# Patient Record
Sex: Female | Born: 1965 | Hispanic: No | Smoking: Current every day smoker
Health system: Southern US, Community
[De-identification: ages and names within clinical notes are randomized; demographics above are authoritative.]

## PROBLEM LIST (undated history)

## (undated) DIAGNOSIS — G5600 Carpal tunnel syndrome, unspecified upper limb: Secondary | ICD-10-CM

## (undated) DIAGNOSIS — M5136 Other intervertebral disc degeneration, lumbar region: Secondary | ICD-10-CM

## (undated) DIAGNOSIS — M51369 Other intervertebral disc degeneration, lumbar region without mention of lumbar back pain or lower extremity pain: Secondary | ICD-10-CM

## (undated) DIAGNOSIS — M5030 Other cervical disc degeneration, unspecified cervical region: Secondary | ICD-10-CM

## (undated) HISTORY — PX: OVARIAN CYST REMOVAL: SHX89

## (undated) HISTORY — PX: OOPHORECTOMY: SHX86

---

## 2002-07-22 ENCOUNTER — Encounter: Payer: Self-pay | Admitting: Emergency Medicine

## 2002-07-22 ENCOUNTER — Emergency Department (HOSPITAL_COMMUNITY): Admission: EM | Admit: 2002-07-22 | Discharge: 2002-07-22 | Payer: Self-pay | Admitting: Emergency Medicine

## 2005-09-09 ENCOUNTER — Emergency Department (HOSPITAL_COMMUNITY): Admission: EM | Admit: 2005-09-09 | Discharge: 2005-09-09 | Payer: Self-pay | Admitting: Emergency Medicine

## 2006-09-29 ENCOUNTER — Ambulatory Visit (HOSPITAL_COMMUNITY): Admission: RE | Admit: 2006-09-29 | Discharge: 2006-09-29 | Payer: Self-pay | Admitting: Family Medicine

## 2006-11-05 ENCOUNTER — Ambulatory Visit (HOSPITAL_COMMUNITY): Admission: RE | Admit: 2006-11-05 | Discharge: 2006-11-05 | Payer: Self-pay | Admitting: Obstetrics

## 2006-12-02 ENCOUNTER — Ambulatory Visit (HOSPITAL_COMMUNITY): Admission: RE | Admit: 2006-12-02 | Discharge: 2006-12-02 | Payer: Self-pay | Admitting: Obstetrics

## 2007-01-09 ENCOUNTER — Ambulatory Visit (HOSPITAL_COMMUNITY): Admission: RE | Admit: 2007-01-09 | Discharge: 2007-01-09 | Payer: Self-pay | Admitting: Obstetrics

## 2007-03-04 ENCOUNTER — Ambulatory Visit (HOSPITAL_COMMUNITY): Admission: RE | Admit: 2007-03-04 | Discharge: 2007-03-04 | Payer: Self-pay | Admitting: Obstetrics

## 2007-03-26 ENCOUNTER — Ambulatory Visit (HOSPITAL_COMMUNITY): Admission: RE | Admit: 2007-03-26 | Discharge: 2007-03-26 | Payer: Self-pay | Admitting: Obstetrics

## 2007-03-26 ENCOUNTER — Encounter: Payer: Self-pay | Admitting: Obstetrics

## 2007-08-13 ENCOUNTER — Ambulatory Visit (HOSPITAL_COMMUNITY): Admission: RE | Admit: 2007-08-13 | Discharge: 2007-08-13 | Payer: Self-pay | Admitting: Obstetrics

## 2008-06-20 IMAGING — MG MM DIGITAL SCREENING BILAT
5 series · 5 of 5 positions shown · non-contrast
Comparison: none

DG SCREEN MAMMOGRAM BILATERAL
Bilateral CC and MLO view(s) were taken.

DIGITAL SCREENING MAMMOGRAM WITH CAD:
There is a fibroglandular pattern.  No masses or malignant type calcifications are identified.

[R CC]
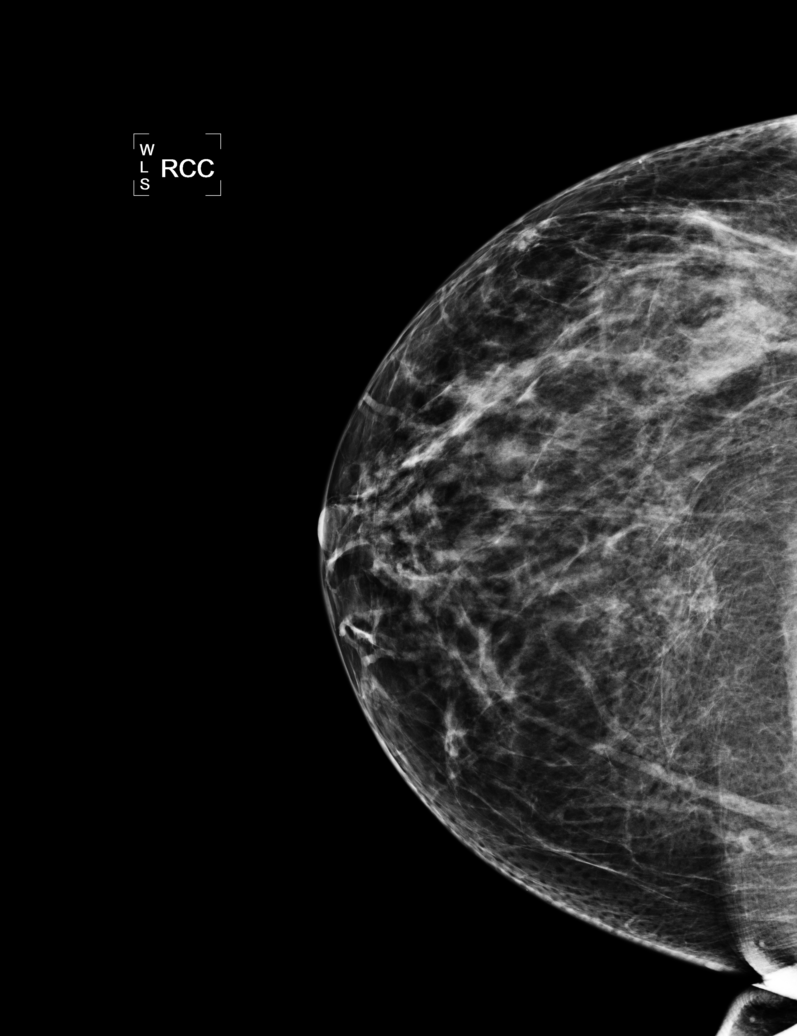

[R MLO]
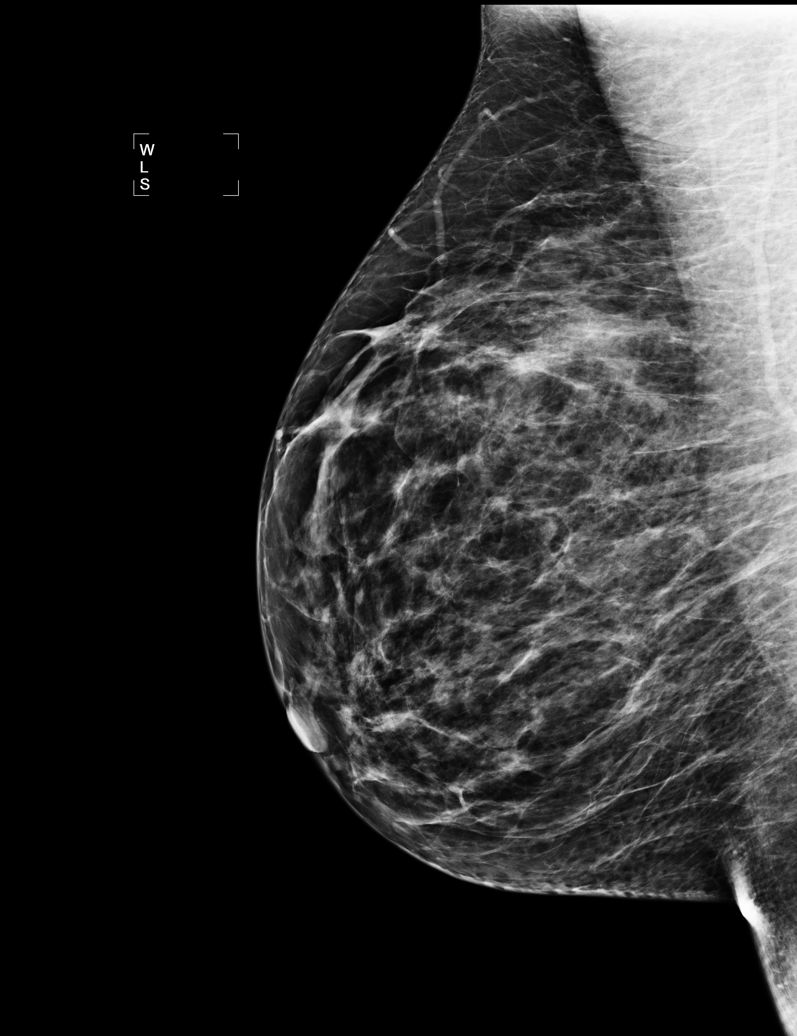

[L CC]
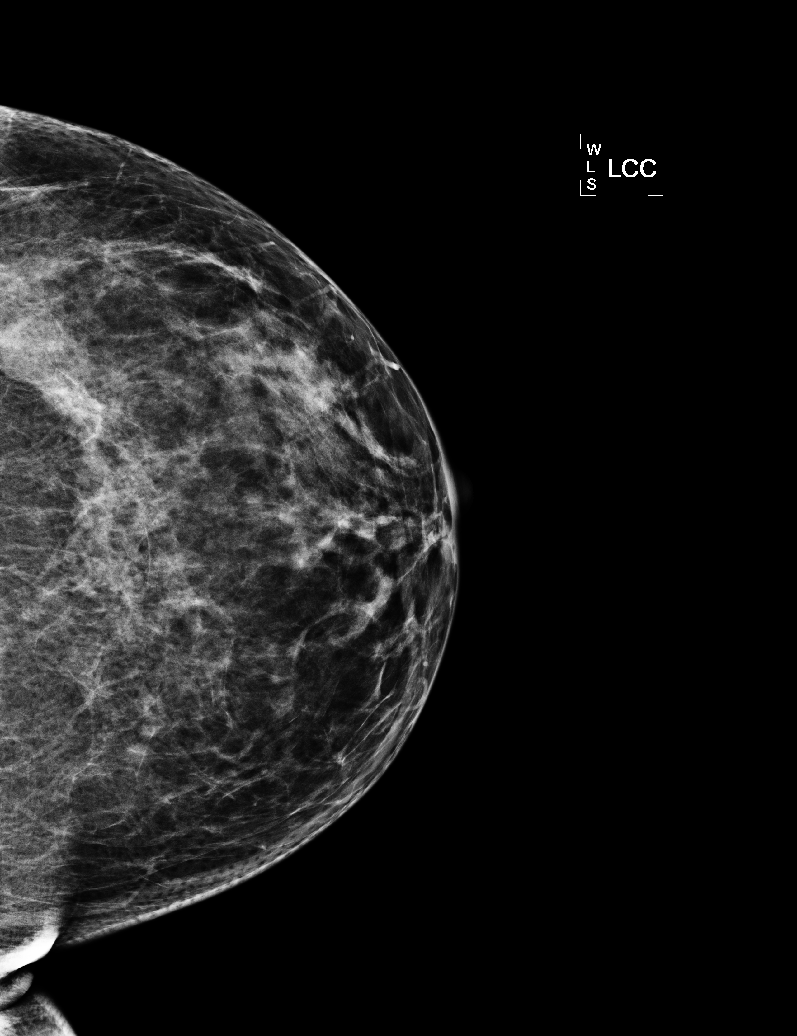

[L MLO (1 of 2)]
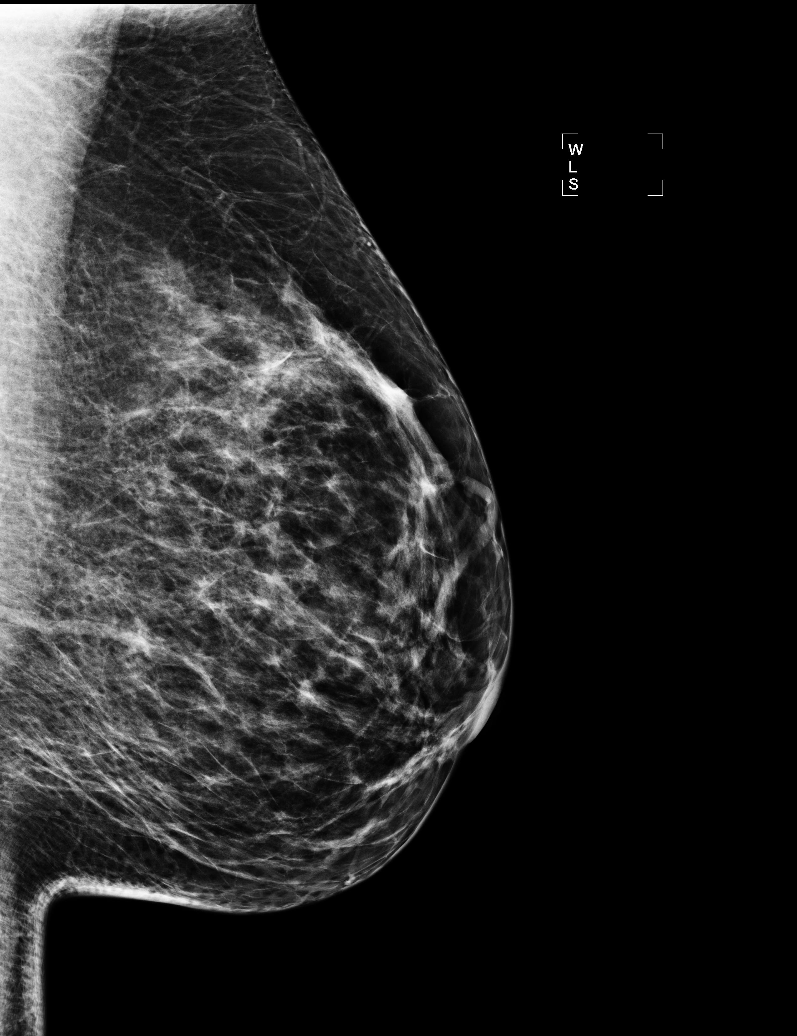

[L MLO (2 of 2)]
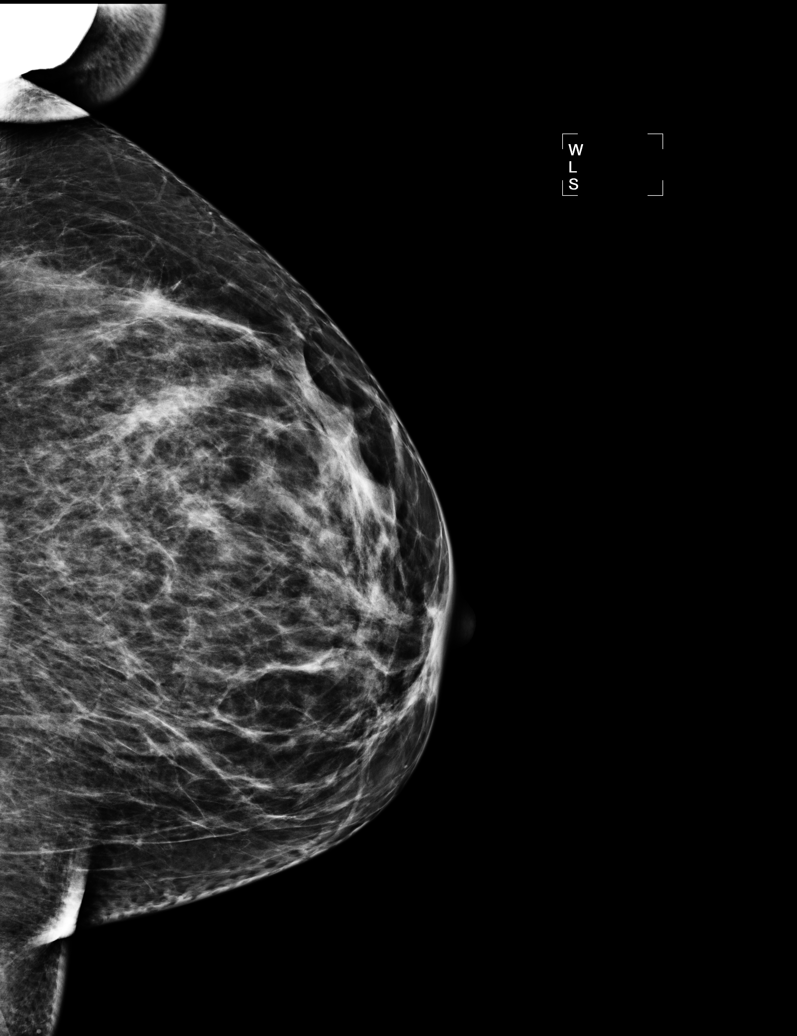

[5 of 5 positions shown; findings below may reference images not displayed]

IMPRESSION: No specific mammographic evidence of malignancy.  Next screening mammogram is recommended in one 
year.

ASSESSMENT: Negative - BI-RADS 1

Screening mammogram in 1 year.
ANALYZED BY COMPUTER AIDED DETECTION. , THIS PROCEDURE WAS A DIGITAL MAMMOGRAM.

## 2008-10-13 ENCOUNTER — Emergency Department (HOSPITAL_BASED_OUTPATIENT_CLINIC_OR_DEPARTMENT_OTHER): Admission: EM | Admit: 2008-10-13 | Discharge: 2008-10-13 | Payer: Self-pay | Admitting: Emergency Medicine

## 2009-05-18 ENCOUNTER — Emergency Department (HOSPITAL_BASED_OUTPATIENT_CLINIC_OR_DEPARTMENT_OTHER): Admission: EM | Admit: 2009-05-18 | Discharge: 2009-05-18 | Payer: Self-pay | Admitting: Emergency Medicine

## 2009-05-18 ENCOUNTER — Ambulatory Visit: Payer: Self-pay | Admitting: Diagnostic Radiology

## 2010-07-15 ENCOUNTER — Encounter: Payer: Self-pay | Admitting: Obstetrics

## 2010-07-16 ENCOUNTER — Encounter: Payer: Self-pay | Admitting: Obstetrics

## 2010-10-03 LAB — DIFFERENTIAL
Basophils Absolute: 0.1 10*3/uL (ref 0.0–0.1)
Eosinophils Relative: 15 % — ABNORMAL HIGH (ref 0–5)
Lymphocytes Relative: 29 % (ref 12–46)
Lymphs Abs: 2.6 10*3/uL (ref 0.7–4.0)
Neutro Abs: 4.1 10*3/uL (ref 1.7–7.7)
Neutrophils Relative %: 46 % (ref 43–77)

## 2010-10-03 LAB — CBC
HCT: 38.3 % (ref 36.0–46.0)
Platelets: 284 10*3/uL (ref 150–400)
RDW: 13.3 % (ref 11.5–15.5)
WBC: 8.9 10*3/uL (ref 4.0–10.5)

## 2010-10-03 LAB — URINALYSIS, ROUTINE W REFLEX MICROSCOPIC
Bilirubin Urine: NEGATIVE
Ketones, ur: NEGATIVE mg/dL
Nitrite: NEGATIVE
Specific Gravity, Urine: 1.006 (ref 1.005–1.030)
Urobilinogen, UA: 0.2 mg/dL (ref 0.0–1.0)
pH: 6 (ref 5.0–8.0)

## 2010-10-03 LAB — BASIC METABOLIC PANEL
BUN: 10 mg/dL (ref 6–23)
Chloride: 108 mEq/L (ref 96–112)
Creatinine, Ser: 0.8 mg/dL (ref 0.4–1.2)
GFR calc non Af Amer: >60 mL/min (ref >60)

## 2010-10-03 LAB — PREGNANCY, URINE

## 2010-11-06 NOTE — Op Note (Signed)
Angel Tapia, Angel Tapia               ACCOUNT NO.:  [id number]   MEDICAL RECORD NO.:  [id number]          PATIENT TYPE:  AMB   LOCATION:  SDC                           FACILITY:  [facility]   PHYSICIAN:  Charles A. Clearance Coots, M.D.DATE OF BIRTH:  [birth year]   DATE OF PROCEDURE:  03/26/2007  DATE OF DISCHARGE:                               OPERATIVE REPORT   PREOPERATIVE DIAGNOSIS:  Left ovarian cyst.   POSTOPERATIVE DIAGNOSIS:  Left ovarian cyst.   PROCEDURE:  Laparoscopic ovarian cystectomy, left laparoscopic  salpingectomy.   SURGEON:  Coral Ceo, M.D., Antionette Char, M.D.   ANESTHESIA:  General.   ESTIMATED BLOOD LOSS:  Minimal   COMPLICATIONS:  None.   SPECIMEN:  Left ovarian cyst, left fallopian tube.   FINDINGS:  Left ovarian cyst containing chocolate-colored fluid.   OPERATION:  The patient was brought to the operating room after  satisfactory general endotracheal anesthesia.  The abdomen and vagina  was prepped and draped in the usual sterile fashion.  The legs were  brought up in stirrups, and the urinary bladder was emptied of  approximately 100 mL of clear fluid.  Bimanual exam done.  Sterile  speculum inserted in vaginal vault.  An acorn uterine manipulator was  then inserted through the cervix and into the uterine cavity in routine  fashion.  A small inferior umbilical incision was then made with the  scalpel, and the umbilical port was placed in routine fashion using the  open Hasson technique without complications.  A suprapubic port was also  placed in routine fashion along with two lateral lower quadrant ports.  The umbilical port was a 12-mm port along with the suprapubic port, and  the 2 lateral lower quadrant ports were 5-mm ports.  Laparoscopic survey  was then done of the pelvic cavity, and the left ovary was noted to be  enlarged and was grasped with atraumatic grasping forceps.  The ovarian  wall was incised using cutting forceps, and the ovarian cyst  wall was  then identified, and the ovarian capsule was systematically dissected  away from the cyst wall, and the cyst wall was removed.  However, at the  end of procedure, the cyst wall was ruptured, and chocolate-colored  fluid was expelled resembling endometriosis-type contents.  The cyst  wall was removed and submitted to pathology for evaluation.  Fallopian  tube had been traumatized during removal of the left ovarian cyst, and a  decision was made to perform a left salpingectomy to remove the  traumatized fallopian tube.  The fallopian tube was grasped with the  atraumatic grasper, and Kleppinger forceps were placed across the broad  ligament and the systematically cauterized and cut until the corneal end  of the tube was reached.  Then, Kleppinger forceps were placed across  the corneal end of the fallopian tube, and the tube was then completely  excised and submitted to pathology for evaluation.  Pelvic cavity was  then thoroughly irrigated with warm saline solution with the Nezhat  irrigator, and the right Falope right fallopian tube and ovary were  examined, and there were no visual  abnormalities of the right tube and  ovary.  The anterior and posterior cul-de-sacs were examined, and there  were no significant abnormalities noted.  There were no other  significant adhesions noted.  There were no endometriotic implants  observed.  The procedure was therefore terminated, and all instruments  were removed.  The umbilical port was suture ligated with interrupted  sutures in the fascia of 0 Vicryl.  The suprapubic port was suture  ligated with fascial interrupted sutures of 0 Vicryl.  The two lower  quadrant lateral ports were suture ligated with subcuticular sutures of  3-0 Monocryl, and Dermabond adhesive solution was applied to the skin on  each port closure.  Surgical technician indicated that all needle,  sponge and instrument counts were correct x2.  The patient tolerated the   procedure well, was transported to recovery room in satisfactory  condition.      Charles A. Clearance Coots, M.D.  Electronically Signed     CAH/MEDQ  D:  03/26/2007  T:  03/27/2007  Job:  [id number]

## 2011-04-04 LAB — CBC
HCT: 39
Hemoglobin: 13.3
MCV: 89.2
Platelets: 271
RBC: 4.37
WBC: 7

## 2014-03-22 ENCOUNTER — Encounter (HOSPITAL_COMMUNITY): Payer: Self-pay | Admitting: Emergency Medicine

## 2014-03-22 ENCOUNTER — Emergency Department (HOSPITAL_COMMUNITY)
Admission: EM | Admit: 2014-03-22 | Discharge: 2014-03-23 | Disposition: A | Discharge status: Home / Self Care | Payer: Medicaid Other | Attending: Emergency Medicine | Admitting: Emergency Medicine

## 2014-03-22 DIAGNOSIS — Z8669 Personal history of other diseases of the nervous system and sense organs: Secondary | ICD-10-CM | POA: Diagnosis not present

## 2014-03-22 DIAGNOSIS — K625 Hemorrhage of anus and rectum: Secondary | ICD-10-CM | POA: Diagnosis present

## 2014-03-22 DIAGNOSIS — Z3202 Encounter for pregnancy test, result negative: Secondary | ICD-10-CM | POA: Diagnosis not present

## 2014-03-22 DIAGNOSIS — N838 Other noninflammatory disorders of ovary, fallopian tube and broad ligament: Secondary | ICD-10-CM | POA: Insufficient documentation

## 2014-03-22 DIAGNOSIS — Z8739 Personal history of other diseases of the musculoskeletal system and connective tissue: Secondary | ICD-10-CM | POA: Insufficient documentation

## 2014-03-22 DIAGNOSIS — K644 Residual hemorrhoidal skin tags: Secondary | ICD-10-CM | POA: Insufficient documentation

## 2014-03-22 DIAGNOSIS — R109 Unspecified abdominal pain: Secondary | ICD-10-CM | POA: Insufficient documentation

## 2014-03-22 DIAGNOSIS — F17200 Nicotine dependence, unspecified, uncomplicated: Secondary | ICD-10-CM | POA: Insufficient documentation

## 2014-03-22 HISTORY — DX: Carpal tunnel syndrome, unspecified upper limb: G56.00

## 2014-03-22 HISTORY — DX: Other cervical disc degeneration, unspecified cervical region: M50.30

## 2014-03-22 HISTORY — DX: Other intervertebral disc degeneration, lumbar region without mention of lumbar back pain or lower extremity pain: M51.369

## 2014-03-22 HISTORY — DX: Other intervertebral disc degeneration, lumbar region: M51.36

## 2014-03-22 NOTE — ED Provider Notes (Signed)
CSN: [id number]     Arrival date & time 03/22/14  2223 History   This chart was scribed for Lyanne Co, MD, by Yevette Edwards, ED Scribe. This patient was seen in room APA19/APA19 and the patient's care was started at 11:51 PM.  First MD Initiated Contact with Patient 03/22/14 2349     Chief Complaint  Patient presents with  . Rectal Bleeding    The history is provided by the patient. No language interpreter was used.    HPI Comments: Angel Tapia is a 48 y.o. female who presents to the Emergency Department complaining of three months of intermittent rectal bleeding which has increased in the past four days and which is associated with rectal pain. The pt reports blood on the toilet paper after a BM as well as blood in the toilet after a BM; she denies blood without attempted BM and she denies hematochezia. She correlates the rectal bleeding to menstruations stating rectal bleeding occurs prior to menstruation and then stops during menstruation. She reports her menstrual cycle is 10 days late; she denies a h/o irregular menstruations. She also endorses associated right-sided abdominal pain which radiates to her back and her leg. The pain is increased with standing and squatting. She reports the abdominal pain is constant, and she denies a h/o similar symptoms. She also endorses low-grade fever; in the ED her temperature is 99 F. She denies constipation and diarrhea.   She voices an right oophorectomy due to a cyst. She denies a h/o Caesarian sections.   Past Medical History  Diagnosis Date  . DDD (degenerative disc disease), cervical   . DDD (degenerative disc disease), lumbar   . Carpal tunnel syndrome    Past Surgical History  Procedure Laterality Date  . Ovarian cyst removal    . Oophorectomy     No family history on file. History  Substance Use Topics  . Smoking status: Current Every Day Smoker -- 1.00 packs/day  . Smokeless tobacco: Not on file  . Alcohol Use: No   No OB  history provided.  Review of Systems  A complete 10 system review of systems was obtained, and all systems were negative except where indicated in the HPI and PE.    Allergies  Review of patient's allergies indicates no known allergies.  Home Medications   Prior to Admission medications   Not on File   Triage Vitals: BP 145/90  Pulse 79  Temp(Src) 99 F (37.2 C) (Oral)  Resp 22  Ht 5\' 3"  (1.6 m)  Wt 160 lb (72.576 kg)  BMI 28.35 kg/m2  SpO2 100%  LMP 02/15/2014  Physical Exam  Nursing note and vitals reviewed. Constitutional: She is oriented to person, place, and time. She appears well-developed and well-nourished.  HENT:  Head: Normocephalic.  Eyes: EOM are normal.  Neck: Normal range of motion.  Pulmonary/Chest: Effort normal.  Abdominal: She exhibits no distension.  Moderate tenderness to right side of abdomen.   Genitourinary:  Rectal exam performed with permission of pt. Chaperon present. Small non-bleeding external hemorrhoid. No fissures noted. No palpable hemorrhoids. No masses. No gross blood.   Musculoskeletal: Normal range of motion.  Neurological: She is alert and oriented to person, place, and time.  Psychiatric: She has a normal mood and affect.    ED Course  Procedures (including critical care time)  DIAGNOSTIC STUDIES: Oxygen Saturation is 100% on room air, normal by my interpretation.    COORDINATION OF CARE:  12:01 AM- Discussed treatment  plan with patient, and the patient agreed to the plan. The plan includes lab work, a CT scan, and pain medication.   Labs Review Labs Reviewed  URINALYSIS, ROUTINE W REFLEX MICROSCOPIC - Abnormal; Notable for the following:    Specific Gravity, Urine <1.005 (*)    All other components within normal limits  CBC  BASIC METABOLIC PANEL  PREGNANCY, URINE    Imaging Review Ct Abdomen Pelvis W Contrast  03/23/2014   CLINICAL DATA:  Rectal bleeding intermittently for 4 months. Right-sided abdominal pain.   EXAM: CT ABDOMEN AND PELVIS WITH CONTRAST  TECHNIQUE: Multidetector CT imaging of the abdomen and pelvis was performed using the standard protocol following bolus administration of intravenous contrast.  CONTRAST:  50mL OMNIPAQUE IOHEXOL 300 MG/ML SOLN, OMNIPAQUE IOHEXOL 300 MG/ML SOLN  COMPARISON:  None.  FINDINGS: Lung bases demonstrate tiny nodules on the left, with 2 nodules measuring 2 mm diameter.  Mild prominence of intrahepatic bile ducts without significant extrahepatic bile duct dilatation. No common duct stones are visualized. Gallbladder is unremarkable. Correlate with liver function tests. No focal liver lesions appreciated. Pancreas, spleen, adrenal glands, kidneys, abdominal aorta, inferior vena cava, and retroperitoneal lymph nodes are unremarkable. Stomach, small bowel, and colon appear normal for degree of distention. Contrast material flows through to the colon without evidence of obstruction. No free air or free fluid in the abdomen.  Pelvis: Appendix is normal. Rectosigmoid colon is unremarkable for under distended state. Bladder wall is not thickened. Uterus is normal. Ovaries are prominent, probably containing cysts. Right ovary measures 5.4 x 4.1 cm and left 3.9 x 3.2 cm. Consider ultrasound for further evaluation. No free or loculated pelvic fluid collections. No destructive bone lesions.  IMPRESSION: Prominence of the ovaries with right ovary measuring 5.4 x 4.1 cm, likely cysts. Recommend ultrasound for characterization. No acute inflammatory process demonstrated in the abdomen or pelvis. Nonspecific mild prominence of intrahepatic bile ducts. Correlate with liver function test. If findings suggests obstruction, consider elective MRCP for further evaluation. 2 mm nodules in the left lung base. If the patient is at high risk for bronchogenic carcinoma, follow-up chest CT at 1 year is recommended. If the patient is at low risk, no follow-up is needed. This recommendation follows the  consensus statement: Guidelines for Management of Small Pulmonary Nodules Detected on CT Scans: A Statement from the Fleischner Society as published in Radiology 2005; 237:395-400.   Electronically Signed   By: Burman Nieves M.D.   On: 03/23/2014 02:33  I personally reviewed the imaging tests through PACS system I reviewed available ER/hospitalization records through the EMR     EKG Interpretation None      MDM   Final diagnoses:  Abdominal pain, unspecified abdominal location  Rectal bleeding  Mass of right ovary    Nonspecific rectal bleeding.  Outpatient GI followup.  Vital signs normal.  Mild right-sided abdominal pain.  Given the persistence of this a CT scan was performed.  Prominent right ovary was found and an outpatient ultrasound was ordered to be performed at [facility].  She was given information regarding her primary care doctor.  I also informed the patient of her pulmonary nodule that will need repeat CT scan in one year.  She does smoke cigarettes.  Overall the patient is feeling better.  Discharge home in good condition.  She understands to return to the ER for new or worsening symptoms  I personally performed the services described in this documentation, which was scribed in  my presence. The recorded information has been reviewed and is accurate.        Lyanne CoKevin M Fabricio Endsley, MD 03/23/14 [phone]

## 2014-03-22 NOTE — ED Notes (Signed)
Pt c/o rectal bleeding x 2 months. States it isn't with her stool but rather bright red blood "just coming out" off and on the last couple of months. Pt states today she began passing darker red blood "clots".

## 2014-03-23 ENCOUNTER — Emergency Department (HOSPITAL_COMMUNITY): Payer: Medicaid Other

## 2014-03-23 LAB — BASIC METABOLIC PANEL
ANION GAP: 10 (ref 5–15)
BUN: 10 mg/dL (ref 6–23)
CALCIUM: 9.1 mg/dL (ref 8.4–10.5)
CHLORIDE: 101 meq/L (ref 96–112)
CO2: 26 meq/L (ref 19–32)
Creatinine, Ser: 0.78 mg/dL (ref 0.50–1.10)
GFR calc non Af Amer: >90 mL/min (ref >90)
Glucose, Bld: 88 mg/dL (ref 70–99)
Potassium: 3.7 mEq/L (ref 3.7–5.3)
SODIUM: 137 meq/L (ref 137–147)

## 2014-03-23 LAB — URINALYSIS, ROUTINE W REFLEX MICROSCOPIC
BILIRUBIN URINE: NEGATIVE
Glucose, UA: NEGATIVE mg/dL
Hgb urine dipstick: NEGATIVE
Ketones, ur: NEGATIVE mg/dL
Leukocytes, UA: NEGATIVE
NITRITE: NEGATIVE
PROTEIN: NEGATIVE mg/dL
UROBILINOGEN UA: 0.2 mg/dL (ref 0.0–1.0)
pH: 5.5 (ref 5.0–8.0)

## 2014-03-23 LAB — CBC
HCT: 38.6 % (ref 36.0–46.0)
Hemoglobin: 13.2 g/dL (ref 12.0–15.0)
MCH: 28.9 pg (ref 26.0–34.0)
MCHC: 34.2 g/dL (ref 30.0–36.0)
MCV: 84.6 fL (ref 78.0–100.0)
PLATELETS: 335 10*3/uL (ref 150–400)
RBC: 4.56 MIL/uL (ref 3.87–5.11)
RDW: 14.6 % (ref 11.5–15.5)
WBC: 7.3 10*3/uL (ref 4.0–10.5)

## 2014-03-23 LAB — PREGNANCY, URINE: PREG TEST UR: NEGATIVE

## 2014-03-23 MED ORDER — HYDROCODONE-ACETAMINOPHEN 5-325 MG PO TABS: 1.0000 | ORAL_TABLET | ORAL | Status: DC | PRN

## 2014-03-23 MED ORDER — IOHEXOL 300 MG/ML  SOLN
50.0000 mL | Freq: Once | INTRAMUSCULAR | Status: AC | PRN
  Administered 2014-03-23: 50 mL via ORAL

## 2014-03-23 MED ORDER — MORPHINE SULFATE 4 MG/ML IJ SOLN
8.0000 mg | Freq: Once | INTRAMUSCULAR | Status: AC
  Administered 2014-03-23: 8 mg via INTRAVENOUS
  Filled 2014-03-23: qty 2

## 2014-03-23 MED ORDER — IOHEXOL 300 MG/ML  SOLN
100.0000 mL | Freq: Once | INTRAMUSCULAR | Status: AC | PRN
  Administered 2014-03-23: 100 mL via INTRAVENOUS

## 2014-03-23 MED ORDER — HYDROCODONE-ACETAMINOPHEN 5-325 MG PO TABS
1.0000 | ORAL_TABLET | Freq: Once | ORAL | Status: AC
  Administered 2014-03-23: 1 via ORAL
  Filled 2014-03-23: qty 1

## 2014-03-28 ENCOUNTER — Telehealth: Payer: Self-pay

## 2014-03-28 NOTE — Telephone Encounter (Signed)
patient called to schedule colonoscopy  [phone]

## 2014-04-06 NOTE — Telephone Encounter (Signed)
LMOM to call.

## 2014-04-06 NOTE — Telephone Encounter (Signed)
Pt having rectal bleeding needs a NPV WITHIN 1-2 WEEKS TO DISCUSS RECTAL BLEEDING AND TCS.

## 2014-04-07 ENCOUNTER — Encounter: Payer: Self-pay | Admitting: Gastroenterology

## 2014-04-08 NOTE — Telephone Encounter (Signed)
Called pt and she said ok to schedule appt. Please leave the message on Vm if she does not answer.  She has a bad signal sometimes.

## 2014-04-08 NOTE — Telephone Encounter (Signed)
OPV OCT 21 AT 1130.

## 2014-04-11 NOTE — Telephone Encounter (Signed)
APPT MADE AND L/M ON CELL PHONE

## 2014-04-13 ENCOUNTER — Ambulatory Visit: Payer: Medicaid Other | Admitting: Gastroenterology

## 2014-04-15 ENCOUNTER — Ambulatory Visit: Payer: Medicaid Other | Admitting: Gastroenterology

## 2014-05-11 ENCOUNTER — Ambulatory Visit: Payer: Medicaid Other | Admitting: Gastroenterology

## 2014-05-17 ENCOUNTER — Ambulatory Visit: Payer: Medicaid Other | Admitting: Gastroenterology

## 2014-05-17 ENCOUNTER — Telehealth: Payer: Self-pay | Admitting: Gastroenterology

## 2014-05-17 NOTE — Telephone Encounter (Signed)
Patient called and confirmed appt 05/16/14 and was a no show on 05/17/14

## 2016-08-11 ENCOUNTER — Emergency Department (HOSPITAL_COMMUNITY)
Admission: EM | Admit: 2016-08-11 | Discharge: 2016-08-12 | Disposition: A | Discharge status: Home / Self Care | Payer: Medicaid Other | Attending: Emergency Medicine | Admitting: Emergency Medicine

## 2016-08-11 ENCOUNTER — Encounter (HOSPITAL_COMMUNITY): Payer: Self-pay

## 2016-08-11 DIAGNOSIS — J329 Chronic sinusitis, unspecified: Secondary | ICD-10-CM | POA: Insufficient documentation

## 2016-08-11 DIAGNOSIS — F17200 Nicotine dependence, unspecified, uncomplicated: Secondary | ICD-10-CM | POA: Insufficient documentation

## 2016-08-11 DIAGNOSIS — J111 Influenza due to unidentified influenza virus with other respiratory manifestations: Secondary | ICD-10-CM | POA: Insufficient documentation

## 2016-08-11 NOTE — ED Provider Notes (Signed)
[facility] DEPT Provider Note   CSN: [id number] Arrival date & time: 08/11/16  2234     History   Chief Complaint Chief Complaint  Patient presents with  . Generalized Body Aches  . Fever    HPI Angel Tapia is a 51 y.o. female.  The history is provided by the patient.  URI   This is a new problem. The current episode started 2 days ago. The problem has been gradually worsening. The maximum temperature recorded prior to her arrival was 102 to 102.9 F. Associated symptoms include nausea, congestion, headaches, sneezing, swollen glands and cough. Pertinent negatives include no chest pain, no abdominal pain, no diarrhea, no vomiting, no dysuria, no sore throat, no neck pain and no wheezing. Associated symptoms comments: Glands sore in neck. She has tried NSAIDs for the symptoms. The treatment provided mild relief.    Past Medical History:  Diagnosis Date  . Carpal tunnel syndrome   . DDD (degenerative disc disease), cervical   . DDD (degenerative disc disease), lumbar     There are no active problems to display for this patient.   Past Surgical History:  Procedure Laterality Date  . OOPHORECTOMY    . OVARIAN CYST REMOVAL      OB History    No data available       Home Medications    Prior to Admission medications   Not on File    Family History No family history on file.  Social History Social History  Substance Use Topics  . Smoking status: Current Every Day Smoker    Packs/day: 1.00  . Smokeless tobacco: Never Used  . Alcohol use No     Allergies   Patient has no known allergies.   Review of Systems Review of Systems  Constitutional: Positive for fever. Negative for activity change.       All ROS Neg except as noted in HPI  HENT: Positive for congestion and sneezing. Negative for nosebleeds and sore throat.   Eyes: Negative for photophobia and discharge.  Respiratory: Positive for cough. Negative for shortness of breath and wheezing.     Cardiovascular: Negative for chest pain and palpitations.  Gastrointestinal: Positive for nausea. Negative for abdominal pain, blood in stool, diarrhea and vomiting.  Genitourinary: Negative for dysuria, frequency and hematuria.  Musculoskeletal: Positive for myalgias. Negative for arthralgias, back pain and neck pain.  Skin: Negative.   Neurological: Positive for headaches. Negative for dizziness, seizures and speech difficulty.  Psychiatric/Behavioral: Negative for confusion and hallucinations.     Physical Exam Updated Vital Signs BP 149/87 (BP Location: Left Arm)   Pulse 90   Temp 98.7 F (37.1 C) (Oral)   Resp 17   Ht 5\' 3"  (1.6 m)   Wt 77.1 kg   LMP 08/11/2016 (LMP Unknown)   SpO2 98%   BMI 30.11 kg/m   Physical Exam  Constitutional: She is oriented to person, place, and time. She appears well-developed and well-nourished.  Non-toxic appearance.  HENT:  Head: Normocephalic.  Right Ear: Tympanic membrane and external ear normal.  Left Ear: Tympanic membrane and external ear normal.  Pain to percussion over sinuses. Nasal congestion present.  Eyes: EOM and lids are normal. Pupils are equal, round, and reactive to light.  Neck: Normal range of motion. Neck supple. Carotid bruit is not present.  Cardiovascular: Normal rate, regular rhythm, normal heart sounds, intact distal pulses and normal pulses.   Pulmonary/Chest: No respiratory distress. She has wheezes.  Soft wheezes of  the upper lung area  Abdominal: Soft. Bowel sounds are normal. There is no tenderness. There is no guarding.  Musculoskeletal: Normal range of motion.  Lymphadenopathy:       Head (right side): No submandibular adenopathy present.       Head (left side): No submandibular adenopathy present.    She has no cervical adenopathy.  Neurological: She is alert and oriented to person, place, and time. She has normal strength. No cranial nerve deficit or sensory deficit.  Skin: Skin is warm and dry.   Psychiatric: She has a normal mood and affect. Her speech is normal.  Nursing note and vitals reviewed.    ED Treatments / Results  Labs (all labs ordered are listed, but only abnormal results are displayed) Labs Reviewed - No data to display  EKG  EKG Interpretation None       Radiology No results found.  Procedures Procedures (including critical care time)  Medications Ordered in ED Medications - No data to display   Initial Impression / Assessment and Plan / ED Course  I have reviewed the triage vital signs and the nursing notes.  Pertinent labs & imaging results that were available during my care of the patient were reviewed by me and considered in my medical decision making (see chart for details).     *I have reviewed nursing notes, vital signs, and all appropriate lab and imaging results for this patient.**  Final Clinical Impressions(s) / ED Diagnoses MDM Pt has hx of fever, chills, bodyaches, and sinus headaches. Pt is able to hydrate. Pulse ox 98% on room air. Pt speaks in complete sentences. I suspect influenza and sinusitis. Pt to be placed on cleocin, decadron and claritin D. Pt to follow up with primary MD or return to the ED if any changes or problem. Pt in agreement with this plan.   Final diagnoses:  None    New Prescriptions New Prescriptions   No medications on file     Ivery QualeHobson Azizi Bally, PA-C 08/12/16 1149    Zadie Rhineonald Wickline, MD 08/13/16 [phone]

## 2016-08-11 NOTE — ED Triage Notes (Signed)
Reports of body aches, fever and productive cough. States her "lungs feel sore".

## 2016-08-12 MED ORDER — IBUPROFEN 800 MG PO TABS
800.0000 mg | ORAL_TABLET | Freq: Once | ORAL | Status: AC
  Administered 2016-08-12: 800 mg via ORAL
  Filled 2016-08-12: qty 1

## 2016-08-12 MED ORDER — LORATADINE-PSEUDOEPHEDRINE ER 5-120 MG PO TB12: 1.0000 | ORAL_TABLET | Freq: Two times a day (BID) | ORAL | 0 refills | Status: DC

## 2016-08-12 MED ORDER — DEXAMETHASONE 4 MG PO TABS: 4.0000 mg | ORAL_TABLET | Freq: Two times a day (BID) | ORAL | 0 refills | Status: DC

## 2016-08-12 MED ORDER — ONDANSETRON HCL 4 MG PO TABS
4.0000 mg | ORAL_TABLET | Freq: Once | ORAL | Status: AC
  Administered 2016-08-12: 4 mg via ORAL
  Filled 2016-08-12: qty 1

## 2016-08-12 MED ORDER — CLINDAMYCIN HCL 300 MG PO CAPS: ORAL_CAPSULE | ORAL | 0 refills | Status: DC

## 2016-08-12 MED ORDER — CLINDAMYCIN HCL 150 MG PO CAPS
300.0000 mg | ORAL_CAPSULE | Freq: Once | ORAL | Status: AC
  Administered 2016-08-12: 300 mg via ORAL
  Filled 2016-08-12: qty 2

## 2016-08-12 MED ORDER — PSEUDOEPHEDRINE HCL 60 MG PO TABS
60.0000 mg | ORAL_TABLET | Freq: Once | ORAL | Status: AC
Start: 2016-08-12 — End: 2016-08-12
  Administered 2016-08-12: 60 mg via ORAL
  Filled 2016-08-12: qty 1

## 2016-08-12 MED ORDER — PREDNISONE 20 MG PO TABS
40.0000 mg | ORAL_TABLET | Freq: Once | ORAL | Status: AC
  Administered 2016-08-12: 40 mg via ORAL
  Filled 2016-08-12: qty 2

## 2016-08-12 MED ORDER — IBUPROFEN 600 MG PO TABS: 600.0000 mg | ORAL_TABLET | Freq: Four times a day (QID) | ORAL | 0 refills | Status: AC | PRN

## 2016-08-12 NOTE — Discharge Instructions (Signed)
Your examination suggests influenza and sinusitis. Please increase water, juices, Gatorade, Kool-Aid, etc.. Please wash hands frequently. Usually mask until symptoms have resolved. Use Cleocin 3 times daily with food. Use Decadron and Claritin-D daily. Use ibuprofen every 6 hours as needed for discomfort or fever. Please see Angel Tapia or return to the emergency department if not improving.

## 2016-08-21 ENCOUNTER — Emergency Department (HOSPITAL_COMMUNITY): Payer: Self-pay

## 2016-08-21 ENCOUNTER — Encounter (HOSPITAL_COMMUNITY): Payer: Self-pay | Admitting: *Deleted

## 2016-08-21 ENCOUNTER — Emergency Department (HOSPITAL_COMMUNITY)
Admission: EM | Admit: 2016-08-21 | Discharge: 2016-08-21 | Disposition: A | Discharge status: Home / Self Care | Payer: Self-pay | Attending: Emergency Medicine | Admitting: Emergency Medicine

## 2016-08-21 DIAGNOSIS — F17200 Nicotine dependence, unspecified, uncomplicated: Secondary | ICD-10-CM | POA: Insufficient documentation

## 2016-08-21 DIAGNOSIS — B349 Viral infection, unspecified: Secondary | ICD-10-CM | POA: Insufficient documentation

## 2016-08-21 LAB — BASIC METABOLIC PANEL
Anion gap: 7 (ref 5–15)
BUN: 13 mg/dL (ref 6–20)
CHLORIDE: 102 mmol/L (ref 101–111)
CO2: 28 mmol/L (ref 22–32)
CREATININE: 0.83 mg/dL (ref 0.44–1.00)
Calcium: 9.1 mg/dL (ref 8.9–10.3)
Glucose, Bld: 111 mg/dL — ABNORMAL HIGH (ref 65–99)
Potassium: 4 mmol/L (ref 3.5–5.1)
Sodium: 137 mmol/L (ref 135–145)

## 2016-08-21 LAB — CBC WITH DIFFERENTIAL/PLATELET
BASOS PCT: 1 %
Basophils Absolute: 0.1 10*3/uL (ref 0.0–0.1)
EOS ABS: 0.7 10*3/uL (ref 0.0–0.7)
EOS PCT: 8 %
HCT: 41.6 % (ref 36.0–46.0)
HEMOGLOBIN: 13.5 g/dL (ref 12.0–15.0)
LYMPHS ABS: 3.2 10*3/uL (ref 0.7–4.0)
Lymphocytes Relative: 34 %
MCH: 28.7 pg (ref 26.0–34.0)
MCHC: 32.5 g/dL (ref 30.0–36.0)
MCV: 88.5 fL (ref 78.0–100.0)
Monocytes Absolute: 0.9 10*3/uL (ref 0.1–1.0)
Monocytes Relative: 10 %
NEUTROS PCT: 47 %
Neutro Abs: 4.5 10*3/uL (ref 1.7–7.7)
Platelets: 415 10*3/uL — ABNORMAL HIGH (ref 150–400)
RBC: 4.7 MIL/uL (ref 3.87–5.11)
RDW: 14.9 % (ref 11.5–15.5)
WBC: 9.3 10*3/uL (ref 4.0–10.5)

## 2016-08-21 LAB — URINALYSIS, ROUTINE W REFLEX MICROSCOPIC
Bilirubin Urine: NEGATIVE
Glucose, UA: NEGATIVE mg/dL
Hgb urine dipstick: NEGATIVE
KETONES UR: NEGATIVE mg/dL
LEUKOCYTES UA: NEGATIVE
Nitrite: NEGATIVE
PROTEIN: NEGATIVE mg/dL
Specific Gravity, Urine: 1.01 (ref 1.005–1.030)
pH: 7 (ref 5.0–8.0)

## 2016-08-21 LAB — PREGNANCY, URINE: PREG TEST UR: NEGATIVE

## 2016-08-21 MED ORDER — BENZONATATE 100 MG PO CAPS: 100.0000 mg | ORAL_CAPSULE | Freq: Three times a day (TID) | ORAL | 0 refills | Status: AC | PRN

## 2016-08-21 MED ORDER — IPRATROPIUM-ALBUTEROL 0.5-2.5 (3) MG/3ML IN SOLN
3.0000 mL | Freq: Once | RESPIRATORY_TRACT | Status: AC
  Administered 2016-08-21: 3 mL via RESPIRATORY_TRACT
  Filled 2016-08-21: qty 3

## 2016-08-21 MED ORDER — IBUPROFEN 400 MG PO TABS
400.0000 mg | ORAL_TABLET | Freq: Once | ORAL | Status: AC
  Administered 2016-08-21: 400 mg via ORAL
  Filled 2016-08-21: qty 1

## 2016-08-21 MED ORDER — ACETAMINOPHEN 325 MG PO TABS
650.0000 mg | ORAL_TABLET | Freq: Once | ORAL | Status: AC
  Administered 2016-08-21: 650 mg via ORAL
  Filled 2016-08-21: qty 2

## 2016-08-21 MED ORDER — ALBUTEROL SULFATE HFA 108 (90 BASE) MCG/ACT IN AERS: 2.0000 | INHALATION_SPRAY | RESPIRATORY_TRACT | 0 refills | Status: AC | PRN

## 2016-08-21 NOTE — Discharge Instructions (Signed)
Take the prescriptions as directed.  Use your albuterol inhaler (2 to 4 puffs) every 4 hours for the next 7 days, then as needed for cough, wheezing, or shortness of breath. Take over the counter tylenol and ibuprofen, as directed on packaging, as needed for discomfort.  Call your regular medical doctor tomorrow to schedule a follow up appointment this week.  Return to the Emergency Department immediately if worsening.

## 2016-08-21 NOTE — ED Provider Notes (Signed)
[facility] DEPT Provider Note   CSN: [id number] Arrival date & time: 08/21/16  1841     History   Chief Complaint Chief Complaint  Patient presents with  . Cough    HPI Angel Tapia is a 51 y.o. female.   Cough    Pt was seen at 1935.  Per pt, c/o gradual onset and persistence of constant cough and generalized body aches for the past 3 days. Pt states she was tx for "the flu and a sinus infection" with tamiflu and an antibiotic last week. States she "got better then worse again." Denies fevers, no sore throat, no rash, no CP/SOB, no N/V/D, no abd pain.    Past Medical History:  Diagnosis Date  . Carpal tunnel syndrome   . DDD (degenerative disc disease), cervical   . DDD (degenerative disc disease), lumbar     There are no active problems to display for this patient.   Past Surgical History:  Procedure Laterality Date  . OOPHORECTOMY    . OVARIAN CYST REMOVAL      OB History    No data available       Home Medications    Prior to Admission medications   Medication Sig Start Date End Date Taking? Authorizing Provider  clindamycin (CLEOCIN) 300 MG capsule 1 po tid with food 08/12/16   Ivery Quale, PA-C  dexamethasone (DECADRON) 4 MG tablet Take 1 tablet (4 mg total) by mouth 2 (two) times daily with a meal. 08/12/16   Ivery Quale, PA-C  ibuprofen (ADVIL,MOTRIN) 600 MG tablet Take 1 tablet (600 mg total) by mouth every 6 (six) hours as needed. 08/12/16   Ivery Quale, PA-C  loratadine-pseudoephedrine (CLARITIN-D 12 HOUR) 5-120 MG tablet Take 1 tablet by mouth 2 (two) times daily. 08/12/16   Ivery Quale, PA-C    Family History History reviewed. No pertinent family history.  Social History Social History  Substance Use Topics  . Smoking status: Current Every Day Smoker    Packs/day: 1.00  . Smokeless tobacco: Never Used  . Alcohol use No     Allergies   Patient has no known allergies.   Review of Systems Review of Systems  Respiratory:  Positive for cough.   ROS: Statement: All systems negative except as marked or noted in the HPI; Constitutional: Negative for fever and chills. +generalized body aches.; ; Eyes: Negative for eye pain, redness and discharge. ; ; ENMT: Negative for ear pain, hoarseness, nasal congestion, sinus pressure and sore throat. ; ; Cardiovascular: Negative for chest pain, palpitations, diaphoresis, dyspnea and peripheral edema. ; ; Respiratory: +cough. Negative for wheezing and stridor. ; ; Gastrointestinal: Negative for nausea, vomiting, diarrhea, abdominal pain, blood in stool, hematemesis, jaundice and rectal bleeding. . ; ; Genitourinary: Negative for dysuria, flank pain and hematuria. ; ; Musculoskeletal: Negative for back pain and neck pain. Negative for swelling and trauma.; ; Skin: Negative for pruritus, rash, abrasions, blisters, bruising and skin lesion.; ; Neuro: Negative for headache, lightheadedness and neck stiffness. Negative for weakness, altered level of consciousness, altered mental status, extremity weakness, paresthesias, involuntary movement, seizure and syncope.       Physical Exam Updated Vital Signs BP 143/72 (BP Location: Left Arm)   Pulse 82   Temp 98.6 F (37 C) (Oral)   Resp 18   Ht 5\' 3"  (1.6 m)   Wt 170 lb (77.1 kg)   LMP 08/11/2016 (LMP Unknown)   SpO2 98%   BMI 30.11 kg/m   Physical Exam  1940: Physical examination:  Nursing notes reviewed; Vital signs and O2 SAT reviewed;  Constitutional: Well developed, Well nourished, Well hydrated, In no acute distress; Head:  Normocephalic, atraumatic; Eyes: EOMI, PERRL, No scleral icterus; ENMT: TM's clear bilat. +edemetous nasal turbinates bilat with clear rhinorrhea. Mouth and pharynx without lesions. No tonsillar exudates. No intra-oral edema. No submandibular or sublingual edema. No hoarse voice, no drooling, no stridor. No pain with manipulation of larynx. No trismus. Mouth and pharynx normal, Mucous membranes moist; Neck: Supple,  Full range of motion, No lymphadenopathy. No meningeal signs.; Cardiovascular: Regular rate and rhythm, No gallop; Respiratory: Breath sounds clear & equal bilaterally, exp wheezes bilat. No  Audible wheezing.  Speaking full sentences with ease, Normal respiratory effort/excursion; Chest: Nontender, Movement normal; Abdomen: Soft, Nontender, Nondistended, Normal bowel sounds; Genitourinary: No CVA tenderness; Extremities: Pulses normal, No tenderness, No edema, No calf edema or asymmetry.; Neuro: AA&Ox3, Major CN grossly intact.  Speech clear. No gross focal motor or sensory deficits in extremities. Climbs on and off stretcher easily by herself. Gait steady.; Skin: Color normal, Warm, Dry.   ED Treatments / Results  Labs (all labs ordered are listed, but only abnormal results are displayed)   EKG  EKG Interpretation None       Radiology   Procedures Procedures (including critical care time)  Medications Ordered in ED Medications  ipratropium-albuterol (DUONEB) 0.5-2.5 (3) MG/3ML nebulizer solution 3 mL (3 mLs Nebulization Given 08/21/16 2006)  acetaminophen (TYLENOL) tablet 650 mg (650 mg Oral Given 08/21/16 1955)  ibuprofen (ADVIL,MOTRIN) tablet 400 mg (400 mg Oral Given 08/21/16 1955)     Initial Impression / Assessment and Plan / ED Course  I have reviewed the triage vital signs and the nursing notes.  Pertinent labs & imaging results that were available during my care of the patient were reviewed by me and considered in my medical decision making (see chart for details).  MDM Reviewed: previous chart, nursing note and vitals Reviewed previous: labs Interpretation: labs and x-ray    Results for orders placed or performed during the hospital encounter of 08/21/16  Basic metabolic panel  Result Value Ref Range   Sodium 137 135 - 145 mmol/L   Potassium 4.0 3.5 - 5.1 mmol/L   Chloride 102 101 - 111 mmol/L   CO2 28 22 - 32 mmol/L   Glucose, Bld 111 (H) 65 - 99 mg/dL   BUN  13 6 - 20 mg/dL   Creatinine, Ser 1.610.83 0.44 - 1.00 mg/dL   Calcium 9.1 8.9 - 09.610.3 mg/dL   GFR calc non Af Amer >60 >60 mL/min   GFR calc Af Amer >60 >60 mL/min   Anion gap 7 5 - 15  CBC with Differential  Result Value Ref Range   WBC 9.3 4.0 - 10.5 K/uL   RBC 4.70 3.87 - 5.11 MIL/uL   Hemoglobin 13.5 12.0 - 15.0 g/dL   HCT 04.541.6 40.936.0 - 81.146.0 %   MCV 88.5 78.0 - 100.0 fL   MCH 28.7 26.0 - 34.0 pg   MCHC 32.5 30.0 - 36.0 g/dL   RDW 91.414.9 78.211.5 - 95.615.5 %   Platelets 415 (H) 150 - 400 K/uL   Neutrophils Relative % 47 %   Neutro Abs 4.5 1.7 - 7.7 K/uL   Lymphocytes Relative 34 %   Lymphs Abs 3.2 0.7 - 4.0 K/uL   Monocytes Relative 10 %   Monocytes Absolute 0.9 0.1 - 1.0 K/uL   Eosinophils Relative 8 %   Eosinophils  Absolute 0.7 0.0 - 0.7 K/uL   Basophils Relative 1 %   Basophils Absolute 0.1 0.0 - 0.1 K/uL  Pregnancy, urine  Result Value Ref Range   Preg Test, Ur NEGATIVE NEGATIVE  Urinalysis, Routine w reflex microscopic  Result Value Ref Range   Color, Urine YELLOW YELLOW   APPearance CLEAR CLEAR   Specific Gravity, Urine 1.010 1.005 - 1.030   pH 7.0 5.0 - 8.0   Glucose, UA NEGATIVE NEGATIVE mg/dL   Hgb urine dipstick NEGATIVE NEGATIVE   Bilirubin Urine NEGATIVE NEGATIVE   Ketones, ur NEGATIVE NEGATIVE mg/dL   Protein, ur NEGATIVE NEGATIVE mg/dL   Nitrite NEGATIVE NEGATIVE   Leukocytes, UA NEGATIVE NEGATIVE   Dg Chest 2 View Result Date: 08/21/2016 CLINICAL DATA:  Productive cough.  Shortness breath.  Chest pain. EXAM: CHEST  2 VIEW COMPARISON:  Two-view chest x-ray 05/18/2009 FINDINGS: The heart size is normal. Mild interstitial prominence is evident bilaterally. No focal airspace disease is present. There are no effusions. Mild degenerative changes are again seen within the thoracic spine. IMPRESSION: Slight interstitial coarsening has increased since prior study. This may be chronic, related to smoking. No acute cardiopulmonary disease. Electronically Signed   By: Marin Roberts M.D.   On: 08/21/2016 20:28    2155:  Short neb given for wheezing with improvement: resps continue NAD, lungs CTA bilat, Sats 98-100% R/A. Pt has tol PO well without N/V. Workup reassuring. Tx symptomatically at this time. Dx and testing d/w pt.  Questions answered.  Verb understanding, agreeable to d/c home with outpt f/u.   Final Clinical Impressions(s) / ED Diagnoses   Final diagnoses:  None    New Prescriptions New Prescriptions   No medications on file      Samuel Jester, DO 08/25/16 1750

## 2016-08-21 NOTE — ED Notes (Signed)
Pt ambulatory to waiting room. Pt verbalized understanding of discharge instructions.   

## 2016-08-21 NOTE — ED Triage Notes (Signed)
Pt states she was dx with flu a week ago and states she is not getting any better and states her whole body hurts

## 2019-05-25 ENCOUNTER — Other Ambulatory Visit (HOSPITAL_COMMUNITY): Payer: Self-pay | Admitting: Nurse Practitioner

## 2019-05-25 DIAGNOSIS — Z1231 Encounter for screening mammogram for malignant neoplasm of breast: Secondary | ICD-10-CM

## 2019-05-27 ENCOUNTER — Ambulatory Visit (HOSPITAL_COMMUNITY): Payer: Self-pay

## 2019-05-27 ENCOUNTER — Other Ambulatory Visit: Payer: Self-pay

## 2019-05-27 ENCOUNTER — Ambulatory Visit (HOSPITAL_COMMUNITY)
Admission: RE | Admit: 2019-05-27 | Discharge: 2019-05-27 | Disposition: A | Discharge status: Home / Self Care | Payer: Medicaid Other | Source: Ambulatory Visit | Attending: Nurse Practitioner | Admitting: Nurse Practitioner

## 2019-05-27 DIAGNOSIS — Z1231 Encounter for screening mammogram for malignant neoplasm of breast: Secondary | ICD-10-CM | POA: Diagnosis not present

## 2019-05-31 ENCOUNTER — Encounter: Payer: Self-pay | Admitting: Gastroenterology

## 2019-06-01 ENCOUNTER — Other Ambulatory Visit (HOSPITAL_COMMUNITY): Payer: Self-pay | Admitting: Nurse Practitioner

## 2019-06-01 DIAGNOSIS — R928 Other abnormal and inconclusive findings on diagnostic imaging of breast: Secondary | ICD-10-CM

## 2019-06-08 ENCOUNTER — Encounter (HOSPITAL_COMMUNITY): Payer: Self-pay

## 2019-06-08 ENCOUNTER — Ambulatory Visit (HOSPITAL_COMMUNITY): Payer: Medicaid Other

## 2019-06-08 ENCOUNTER — Encounter (HOSPITAL_COMMUNITY): Payer: Medicaid Other

## 2019-06-08 ENCOUNTER — Ambulatory Visit (HOSPITAL_COMMUNITY): Admission: RE | Admit: 2019-06-08 | Payer: Medicaid Other | Source: Ambulatory Visit

## 2019-06-10 ENCOUNTER — Encounter: Payer: Self-pay | Admitting: General Practice

## 2019-06-22 ENCOUNTER — Ambulatory Visit (HOSPITAL_COMMUNITY)
Admission: RE | Admit: 2019-06-22 | Discharge: 2019-06-22 | Disposition: A | Discharge status: Home / Self Care | Payer: Medicaid Other | Source: Ambulatory Visit | Attending: Nurse Practitioner | Admitting: Nurse Practitioner

## 2019-06-22 ENCOUNTER — Ambulatory Visit: Payer: Medicaid Other | Admitting: Gastroenterology

## 2019-06-22 ENCOUNTER — Telehealth: Payer: Self-pay | Admitting: Gastroenterology

## 2019-06-22 ENCOUNTER — Encounter: Payer: Self-pay | Admitting: Gastroenterology

## 2019-06-22 ENCOUNTER — Other Ambulatory Visit: Payer: Self-pay

## 2019-06-22 DIAGNOSIS — R928 Other abnormal and inconclusive findings on diagnostic imaging of breast: Secondary | ICD-10-CM

## 2019-06-22 NOTE — Telephone Encounter (Signed)
PATIENT WAS A NO SHOW AND LETTER SENT  °

## 2019-07-19 ENCOUNTER — Encounter: Payer: Self-pay | Admitting: Obstetrics & Gynecology

## 2019-07-19 ENCOUNTER — Encounter: Payer: Medicaid Other | Admitting: Obstetrics & Gynecology

## 2019-10-20 ENCOUNTER — Other Ambulatory Visit (HOSPITAL_COMMUNITY): Payer: Self-pay | Admitting: Nurse Practitioner

## 2019-10-20 DIAGNOSIS — M79662 Pain in left lower leg: Secondary | ICD-10-CM

## 2019-10-21 ENCOUNTER — Ambulatory Visit (HOSPITAL_COMMUNITY)
Admission: RE | Admit: 2019-10-21 | Discharge: 2019-10-21 | Disposition: A | Discharge status: Home / Self Care | Payer: Medicaid Other | Source: Ambulatory Visit | Attending: Nurse Practitioner | Admitting: Nurse Practitioner

## 2019-10-21 ENCOUNTER — Other Ambulatory Visit: Payer: Self-pay

## 2019-10-21 ENCOUNTER — Ambulatory Visit (HOSPITAL_COMMUNITY): Payer: Medicaid Other

## 2019-10-21 DIAGNOSIS — M79662 Pain in left lower leg: Secondary | ICD-10-CM | POA: Diagnosis not present

## 2019-10-21 DIAGNOSIS — M7989 Other specified soft tissue disorders: Secondary | ICD-10-CM | POA: Insufficient documentation

## 2023-04-17 ENCOUNTER — Encounter (HOSPITAL_COMMUNITY): Payer: Self-pay

## 2023-04-17 ENCOUNTER — Other Ambulatory Visit: Payer: Self-pay

## 2023-04-17 ENCOUNTER — Observation Stay (HOSPITAL_COMMUNITY)
Admission: EM | Admit: 2023-04-17 | Discharge: 2023-04-19 | Disposition: A | Discharge status: Home / Self Care | Payer: 59 | Attending: General Surgery | Admitting: General Surgery

## 2023-04-17 ENCOUNTER — Emergency Department (HOSPITAL_COMMUNITY): Payer: 59

## 2023-04-17 DIAGNOSIS — G8929 Other chronic pain: Secondary | ICD-10-CM | POA: Diagnosis present

## 2023-04-17 DIAGNOSIS — K838 Other specified diseases of biliary tract: Secondary | ICD-10-CM | POA: Diagnosis not present

## 2023-04-17 DIAGNOSIS — F17200 Nicotine dependence, unspecified, uncomplicated: Secondary | ICD-10-CM | POA: Diagnosis not present

## 2023-04-17 DIAGNOSIS — E785 Hyperlipidemia, unspecified: Secondary | ICD-10-CM | POA: Diagnosis present

## 2023-04-17 DIAGNOSIS — E66811 Obesity, class 1: Secondary | ICD-10-CM

## 2023-04-17 DIAGNOSIS — K3580 Unspecified acute appendicitis: Secondary | ICD-10-CM | POA: Diagnosis not present

## 2023-04-17 DIAGNOSIS — E6609 Other obesity due to excess calories: Secondary | ICD-10-CM

## 2023-04-17 DIAGNOSIS — K35891 Other acute appendicitis without perforation, with gangrene: Secondary | ICD-10-CM

## 2023-04-17 DIAGNOSIS — R1031 Right lower quadrant pain: Secondary | ICD-10-CM | POA: Diagnosis present

## 2023-04-17 LAB — CBC
HCT: 46.1 % — ABNORMAL HIGH (ref 36.0–46.0)
Hemoglobin: 14.9 g/dL (ref 12.0–15.0)
MCH: 26.8 pg (ref 26.0–34.0)
MCHC: 32.3 g/dL (ref 30.0–36.0)
MCV: 83.1 fL (ref 80.0–100.0)
Platelets: 492 10*3/uL — ABNORMAL HIGH (ref 150–400)
RBC: 5.55 MIL/uL — ABNORMAL HIGH (ref 3.87–5.11)
RDW: 16.6 % — ABNORMAL HIGH (ref 11.5–15.5)
WBC: 18.5 10*3/uL — ABNORMAL HIGH (ref 4.0–10.5)
nRBC: 0 % (ref 0.0–0.2)

## 2023-04-17 LAB — COMPREHENSIVE METABOLIC PANEL
ALT: 33 U/L (ref 0–44)
AST: 22 U/L (ref 15–41)
Albumin: 4.5 g/dL (ref 3.5–5.0)
Alkaline Phosphatase: 199 U/L — ABNORMAL HIGH (ref 38–126)
Anion gap: 15 (ref 5–15)
BUN: 11 mg/dL (ref 6–20)
CO2: 21 mmol/L — ABNORMAL LOW (ref 22–32)
Calcium: 9.9 mg/dL (ref 8.9–10.3)
Chloride: 101 mmol/L (ref 98–111)
Creatinine, Ser: 0.7 mg/dL (ref 0.44–1.00)
GFR, Estimated: >60 mL/min (ref >60)
Glucose, Bld: 117 mg/dL — ABNORMAL HIGH (ref 70–99)
Potassium: 4.2 mmol/L (ref 3.5–5.1)
Sodium: 137 mmol/L (ref 135–145)
Total Bilirubin: 0.5 mg/dL (ref 0.3–1.2)
Total Protein: 8.7 g/dL — ABNORMAL HIGH (ref 6.5–8.1)

## 2023-04-17 LAB — LIPASE, BLOOD: Lipase: 25 U/L (ref 11–51)

## 2023-04-17 LAB — LACTIC ACID, PLASMA: Lactic Acid, Venous: 1.6 mmol/L (ref 0.5–1.9)

## 2023-04-17 MED ORDER — HYDROMORPHONE HCL 1 MG/ML IJ SOLN
1.0000 mg | Freq: Once | INTRAMUSCULAR | Status: AC
Start: 2023-04-17 — End: 2023-04-17
  Administered 2023-04-17: 1 mg via INTRAVENOUS
  Filled 2023-04-17: qty 1

## 2023-04-17 MED ORDER — IOHEXOL 300 MG/ML  SOLN
100.0000 mL | Freq: Once | INTRAMUSCULAR | Status: AC | PRN
Start: 2023-04-17 — End: 2023-04-17
  Administered 2023-04-17: 100 mL via INTRAVENOUS

## 2023-04-17 MED ORDER — METRONIDAZOLE 500 MG/100ML IV SOLN
500.0000 mg | Freq: Once | INTRAVENOUS | Status: AC
  Administered 2023-04-17: 500 mg via INTRAVENOUS
  Filled 2023-04-17: qty 100

## 2023-04-17 MED ORDER — SODIUM CHLORIDE 0.9 % IV SOLN
2.0000 g | Freq: Once | INTRAVENOUS | Status: AC
  Administered 2023-04-17: 2 g via INTRAVENOUS
  Filled 2023-04-17: qty 12.5

## 2023-04-17 MED ORDER — SODIUM CHLORIDE 0.9 % IV BOLUS
1000.0000 mL | Freq: Once | INTRAVENOUS | Status: AC
  Administered 2023-04-17: 1000 mL via INTRAVENOUS

## 2023-04-17 MED ORDER — SODIUM CHLORIDE 0.9 % IV BOLUS: 500.0000 mL | Freq: Once | INTRAVENOUS | Status: DC

## 2023-04-17 MED ORDER — ONDANSETRON HCL 4 MG/2ML IJ SOLN
4.0000 mg | Freq: Once | INTRAMUSCULAR | Status: AC
  Administered 2023-04-17: 4 mg via INTRAVENOUS
  Filled 2023-04-17: qty 2

## 2023-04-17 MED ORDER — MORPHINE SULFATE (PF) 4 MG/ML IV SOLN
4.0000 mg | Freq: Once | INTRAVENOUS | Status: AC
Start: 2023-04-17 — End: 2023-04-17
  Administered 2023-04-17: 4 mg via INTRAVENOUS
  Filled 2023-04-17: qty 1

## 2023-04-17 NOTE — Sepsis Progress Note (Addendum)
Elink monitoring for the code sepsis protocol.  Secure chat to MD requesting antibiotic orders.

## 2023-04-17 NOTE — ED Triage Notes (Signed)
RLQ ABD pain x3 days Complains of n/v/d Pain 8/10 Increased pain on palpation per pt

## 2023-04-17 NOTE — ED Provider Notes (Signed)
Angel Tapia EMERGENCY DEPARTMENT AT [facility] Provider Note   CSN: [id number] Arrival date & time: 04/17/23  2124     History  Chief Complaint  Patient presents with   Abdominal Pain    Angel Tapia is a 57 y.o. female.  She is here with a complaint of progressively worsening right lower quadrant abdominal pain that started 3 to 4 days ago.  She said it was fairly mild the first couple days but has gotten progressively worse and now is severe.  Associated with nausea vomiting and a few episodes of loose stools.  No fevers.  No known trauma.  Prior surgical history of tubal ligation.  She is on chronic narcotics for back and knee pain.  No urinary symptoms.  The history is provided by the patient.  Abdominal Pain Pain location:  RLQ Pain quality: aching and stabbing   Pain radiates to:  Does not radiate Pain severity:  Severe Onset quality:  Gradual Duration:  4 days Timing:  Constant Progression:  Worsening Chronicity:  New Context: not trauma   Relieved by:  Nothing Worsened by:  Movement, deep breathing and palpation Ineffective treatments:  None tried Associated symptoms: diarrhea, nausea and vomiting   Associated symptoms: no chest pain, no constipation, no cough, no dysuria, no fever, no hematemesis, no hematochezia, no hematuria and no shortness of breath        Home Medications Prior to Admission medications   Medication Sig Start Date End Date Taking? Authorizing Provider  albuterol (PROVENTIL HFA;VENTOLIN HFA) 108 (90 Base) MCG/ACT inhaler Inhale 2 puffs into the lungs every 4 (four) hours as needed for wheezing or shortness of breath. 08/21/16   Samuel Jester, DO  benzonatate (TESSALON) 100 MG capsule Take 1 capsule (100 mg total) by mouth 3 (three) times daily as needed for cough. 08/21/16   Samuel Jester, DO  clindamycin (CLEOCIN) 300 MG capsule 1 po tid with food Patient not taking: Reported on 08/21/2016 08/12/16   Ivery Quale, PA-C   dexamethasone (DECADRON) 4 MG tablet Take 1 tablet (4 mg total) by mouth 2 (two) times daily with a meal. Patient not taking: Reported on 08/21/2016 08/12/16   Ivery Quale, PA-C  fluticasone [facility]) 50 MCG/ACT nasal spray Place 2 sprays into both nostrils daily.    [provider]  ibuprofen (ADVIL,MOTRIN) 600 MG tablet Take 1 tablet (600 mg total) by mouth every 6 (six) hours as needed. 08/12/16   Ivery Quale, PA-C  loratadine-pseudoephedrine (CLARITIN-D 12 HOUR) 5-120 MG tablet Take 1 tablet by mouth 2 (two) times daily. 08/12/16   Ivery Quale, PA-C  oxyCODONE-acetaminophen (PERCOCET) 10-325 MG tablet Take 1 tablet by mouth every 6 (six) hours as needed for pain.    [provider]  tiZANidine (ZANAFLEX) 4 MG tablet Take 4 mg by mouth at bedtime as needed and may repeat dose one time if needed for muscle spasms.    [provider]      Allergies    Prednisone and Gabapentin (once-daily)    Review of Systems   Review of Systems  Constitutional:  Negative for fever.  Respiratory:  Negative for cough and shortness of breath.   Cardiovascular:  Negative for chest pain.  Gastrointestinal:  Positive for abdominal pain, diarrhea, nausea and vomiting. Negative for constipation, hematemesis and hematochezia.  Genitourinary:  Negative for dysuria and hematuria.    Physical Exam Updated Vital Signs BP (!) 174/100   Pulse (!) 127   Temp 98.9 F (37.2 C) (  Oral)   Resp (!) 26   Ht 5\' 3"  (1.6 m)   Wt 88.9 kg   SpO2 100%   BMI 34.72 kg/m  Physical Exam Vitals and nursing note reviewed.  Constitutional:      General: She is not in acute distress.    Appearance: She is well-developed.  HENT:     Head: Normocephalic and atraumatic.  Eyes:     Conjunctiva/sclera: Conjunctivae normal.  Cardiovascular:     Rate and Rhythm: Regular rhythm. Tachycardia present.     Heart sounds: No murmur heard. Pulmonary:     Effort: Pulmonary effort is normal. No  respiratory distress.     Breath sounds: Normal breath sounds.  Abdominal:     Palpations: Abdomen is soft.     Tenderness: There is abdominal tenderness in the right lower quadrant. There is no guarding or rebound.  Musculoskeletal:        General: No swelling.     Cervical back: Neck supple.  Skin:    General: Skin is warm and dry.     Capillary Refill: Capillary refill takes less than 2 seconds.  Neurological:     General: No focal deficit present.     Mental Status: She is alert.     ED Results / Procedures / Treatments   Labs (all labs ordered are listed, but only abnormal results are displayed) Labs Reviewed  COMPREHENSIVE METABOLIC PANEL - Abnormal; Notable for the following components:      Result Value   CO2 21 (*)    Glucose, Bld 117 (*)    Total Protein 8.7 (*)    Alkaline Phosphatase 199 (*)    All other components within normal limits  CBC - Abnormal; Notable for the following components:   WBC 18.5 (*)    RBC 5.55 (*)    HCT 46.1 (*)    RDW 16.6 (*)    Platelets 492 (*)    All other components within normal limits  URINALYSIS, ROUTINE W REFLEX MICROSCOPIC - Abnormal; Notable for the following components:   Specific Gravity, Urine 1.034 (*)    Hgb urine dipstick SMALL (*)    Bacteria, UA RARE (*)    All other components within normal limits  COMPREHENSIVE METABOLIC PANEL - Abnormal; Notable for the following components:   Glucose, Bld 115 (*)    Alkaline Phosphatase 167 (*)    All other components within normal limits  CBC WITH DIFFERENTIAL/PLATELET - Abnormal; Notable for the following components:   WBC 18.1 (*)    RBC 5.25 (*)    RDW 16.5 (*)    Platelets 436 (*)    Neutro Abs 13.6 (*)    Monocytes Absolute 2.1 (*)    Abs Immature Granulocytes 0.08 (*)    All other components within normal limits  CULTURE, BLOOD (ROUTINE X 2)  CULTURE, BLOOD (ROUTINE X 2)  LIPASE, BLOOD  LACTIC ACID, PLASMA  MAGNESIUM  HIV ANTIBODY (ROUTINE TESTING W REFLEX)     EKG EKG Interpretation Date/Time:  Thursday April 17 2023 21:41:38 EDT Ventricular Rate:  118 PR Interval:  132 QRS Duration:  78 QT Interval:  332 QTC Calculation: 465 R Axis:   53  Text Interpretation: Sinus tachycardia Possible Left atrial enlargement Borderline ECG No previous ECGs available Confirmed by Meridee Score [phone]) on 04/17/2023 10:09:36 PM  Radiology CT ABDOMEN PELVIS W CONTRAST  Result Date: 04/17/2023 CLINICAL DATA:  Right lower quadrant pain for several days EXAM: CT ABDOMEN AND PELVIS WITH  CONTRAST TECHNIQUE: Multidetector CT imaging of the abdomen and pelvis was performed using the standard protocol following bolus administration of intravenous contrast. RADIATION DOSE REDUCTION: This exam was performed according to the departmental dose-optimization program which includes automated exposure control, adjustment of the mA and/or kV according to patient size and/or use of iterative reconstruction technique. CONTRAST:  OMNIPAQUE IOHEXOL 300 MG/ML  SOLN COMPARISON:  03/23/2014 FINDINGS: Lower chest: No acute abnormality. Hepatobiliary: Gallbladder is well distended. No cholelithiasis is noted. Liver is within normal limits with the exception of mild biliary ductal dilatation. Filling defect is noted in the distal common bile duct causing the obstructive change. Common bile duct measures approximately 9 mm. Pancreas: Pancreas is within normal limits with the exception of the gallstone in the distal aspect of the common bile duct. Spleen: Normal in size without focal abnormality. Adrenals/Urinary Tract: Adrenal glands are within normal limits. Kidneys demonstrate a normal enhancement pattern bilaterally. No renal calculi or obstructive changes are seen. The bladder is well distended. Stomach/Bowel: Diverticular change of the colon is noted without evidence of diverticulitis. No obstructive changes of the colon are seen. The appendix is well visualized and dilated. Mild  hyperemia is noted in the mucosa with Peri appendiceal inflammatory changes consistent with early appendicitis. No findings to suggest perforation are noted. Small bowel and stomach are within normal limits. Vascular/Lymphatic: Aortic atherosclerosis. No enlarged abdominal or pelvic lymph nodes. Reproductive: Uterus and bilateral adnexa are unremarkable. Changes of prior tubal ligation are noted. Other: No abdominal wall hernia or abnormality. No abdominopelvic ascites. Musculoskeletal: No acute or significant osseous findings. IMPRESSION: Changes consistent with acute appendicitis without evidence of perforation. Findings consistent with a filling defect in the distal common bile duct consistent with choledocholithiasis with biliary ductal dilatation. Electronically Signed   By: Alcide Clever M.D.   On: 04/17/2023 23:20    Procedures Procedures    Medications Ordered in ED Medications  ondansetron (ZOFRAN) injection 4 mg (has no administration in time range)  sodium chloride 0.9 % bolus 500 mL (has no administration in time range)  morphine (PF) 4 MG/ML injection 4 mg (has no administration in time range)    ED Course/ Medical Decision Making/ A&P Clinical Course as of 04/17/23 2339  Thu Apr 17, 2023  2214 Patient had code sepsis activation by nursing on presentation with tachycardia hypertension tachypnea. [MB]  2338 CT Showing early appendicitis but also has a filling defect in her common bile duct.  Dr. Lovell Sheehan general surgery recommending medical admission and he will consult on the patient.  Patient updated on plan for admission. [MB]    Clinical Course User Index [MB] Terrilee Files, MD                                 Medical Decision Making Amount and/or Complexity of Data Reviewed Labs: ordered. Radiology: ordered.  Risk Prescription drug management. Decision regarding hospitalization.   This patient complains of generalized abdominal pain localizing down the right lower  quadrant; this involves an extensive number of treatment Options and is a complaint that carries with it a high risk of complications and morbidity. The differential includes appendicitis, colitis, perforation, abscess  I ordered, reviewed and interpreted labs, which included CBC with elevated white count, chemistries with mildly low bicarb, elevated alk phos, urinalysis without signs of infection, blood culture sent, lactate normal I ordered medication IV fluids and pain medicine IV antibiotics and reviewed  PMP when indicated. I ordered imaging studies which included CT abdomen and pelvis and I independently    visualized and interpreted imaging which showed probable early appendicitis.  Also has some CBD dilation Previous records obtained and reviewed in epic, no recent admissions I consulted Dr. Lovell Sheehan general surgery and discussed lab and imaging findings and discussed disposition.  Cardiac monitoring reviewed, sinus tachycardia Social determinants considered, tobacco use Critical Interventions: None  After the interventions stated above, I reevaluated the patient and found patient's pain to be improving although not completely resolved Admission and further testing considered, general surgery recommending medical admission.  Patient's care signed out to Dr. Oletta Cohn to discuss with hospitalist regarding admission.         Final Clinical Impression(s) / ED Diagnoses Final diagnoses:  Acute appendicitis, unspecified acute appendicitis type  Common bile duct dilatation    Rx / DC Orders ED Discharge Orders     None         Terrilee Files, MD 04/18/23 1005

## 2023-04-17 NOTE — ED Notes (Signed)
Patient transported to CT 

## 2023-04-18 ENCOUNTER — Other Ambulatory Visit: Payer: Self-pay

## 2023-04-18 ENCOUNTER — Encounter (HOSPITAL_COMMUNITY): Payer: Self-pay | Admitting: Family Medicine

## 2023-04-18 ENCOUNTER — Inpatient Hospital Stay (HOSPITAL_COMMUNITY): Payer: 59 | Admitting: Anesthesiology

## 2023-04-18 ENCOUNTER — Encounter (HOSPITAL_COMMUNITY)
Admission: EM | Disposition: A | Discharge status: Home / Self Care | Payer: Self-pay | Source: Home / Self Care | Attending: Emergency Medicine

## 2023-04-18 DIAGNOSIS — F17200 Nicotine dependence, unspecified, uncomplicated: Secondary | ICD-10-CM | POA: Diagnosis not present

## 2023-04-18 DIAGNOSIS — G8929 Other chronic pain: Secondary | ICD-10-CM | POA: Diagnosis not present

## 2023-04-18 DIAGNOSIS — K352 Acute appendicitis with generalized peritonitis, without perforation or abscess: Secondary | ICD-10-CM

## 2023-04-18 DIAGNOSIS — K3532 Acute appendicitis with perforation and localized peritonitis, without abscess: Secondary | ICD-10-CM | POA: Diagnosis not present

## 2023-04-18 DIAGNOSIS — K353 Acute appendicitis with localized peritonitis, without perforation or gangrene: Secondary | ICD-10-CM | POA: Diagnosis not present

## 2023-04-18 DIAGNOSIS — K3580 Unspecified acute appendicitis: Secondary | ICD-10-CM | POA: Diagnosis not present

## 2023-04-18 DIAGNOSIS — K35891 Other acute appendicitis without perforation, with gangrene: Secondary | ICD-10-CM | POA: Diagnosis not present

## 2023-04-18 DIAGNOSIS — K838 Other specified diseases of biliary tract: Secondary | ICD-10-CM | POA: Diagnosis present

## 2023-04-18 DIAGNOSIS — E6609 Other obesity due to excess calories: Secondary | ICD-10-CM

## 2023-04-18 DIAGNOSIS — E785 Hyperlipidemia, unspecified: Secondary | ICD-10-CM | POA: Diagnosis present

## 2023-04-18 HISTORY — PX: XI ROBOTIC LAPAROSCOPIC ASSISTED APPENDECTOMY: SHX6877

## 2023-04-18 LAB — COMPREHENSIVE METABOLIC PANEL
ALT: 27 U/L (ref 0–44)
AST: 16 U/L (ref 15–41)
Albumin: 3.8 g/dL (ref 3.5–5.0)
Alkaline Phosphatase: 167 U/L — ABNORMAL HIGH (ref 38–126)
Anion gap: 10 (ref 5–15)
BUN: 9 mg/dL (ref 6–20)
CO2: 27 mmol/L (ref 22–32)
Calcium: 9.1 mg/dL (ref 8.9–10.3)
Chloride: 99 mmol/L (ref 98–111)
Creatinine, Ser: 0.65 mg/dL (ref 0.44–1.00)
GFR, Estimated: >60 mL/min (ref >60)
Glucose, Bld: 115 mg/dL — ABNORMAL HIGH (ref 70–99)
Potassium: 4 mmol/L (ref 3.5–5.1)
Sodium: 136 mmol/L (ref 135–145)
Total Bilirubin: 0.5 mg/dL (ref 0.3–1.2)
Total Protein: 7.6 g/dL (ref 6.5–8.1)

## 2023-04-18 LAB — CBC WITH DIFFERENTIAL/PLATELET
Abs Immature Granulocytes: 0.08 10*3/uL — ABNORMAL HIGH (ref 0.00–0.07)
Basophils Absolute: 0.1 10*3/uL (ref 0.0–0.1)
Basophils Relative: 0 %
Eosinophils Absolute: 0.1 10*3/uL (ref 0.0–0.5)
Eosinophils Relative: 0 %
HCT: 44.2 % (ref 36.0–46.0)
Hemoglobin: 13.9 g/dL (ref 12.0–15.0)
Immature Granulocytes: 0 %
Lymphocytes Relative: 12 %
Lymphs Abs: 2.1 10*3/uL (ref 0.7–4.0)
MCH: 26.5 pg (ref 26.0–34.0)
MCHC: 31.4 g/dL (ref 30.0–36.0)
MCV: 84.2 fL (ref 80.0–100.0)
Monocytes Absolute: 2.1 10*3/uL — ABNORMAL HIGH (ref 0.1–1.0)
Monocytes Relative: 12 %
Neutro Abs: 13.6 10*3/uL — ABNORMAL HIGH (ref 1.7–7.7)
Neutrophils Relative %: 76 %
Platelets: 436 10*3/uL — ABNORMAL HIGH (ref 150–400)
RBC: 5.25 MIL/uL — ABNORMAL HIGH (ref 3.87–5.11)
RDW: 16.5 % — ABNORMAL HIGH (ref 11.5–15.5)
WBC: 18.1 10*3/uL — ABNORMAL HIGH (ref 4.0–10.5)
nRBC: 0 % (ref 0.0–0.2)

## 2023-04-18 LAB — MAGNESIUM: Magnesium: 1.9 mg/dL (ref 1.7–2.4)

## 2023-04-18 LAB — URINALYSIS, ROUTINE W REFLEX MICROSCOPIC
Bilirubin Urine: NEGATIVE
Glucose, UA: NEGATIVE mg/dL
Ketones, ur: NEGATIVE mg/dL
Leukocytes,Ua: NEGATIVE
Nitrite: NEGATIVE
Protein, ur: NEGATIVE mg/dL
Specific Gravity, Urine: 1.034 — ABNORMAL HIGH (ref 1.005–1.030)
pH: 6 (ref 5.0–8.0)

## 2023-04-18 LAB — HIV ANTIBODY (ROUTINE TESTING W REFLEX): HIV Screen 4th Generation wRfx: NONREACTIVE

## 2023-04-18 SURGERY — APPENDECTOMY, ROBOT-ASSISTED, LAPAROSCOPIC
Anesthesia: General | Site: Abdomen

## 2023-04-18 MED ORDER — DIPHENHYDRAMINE HCL 50 MG/ML IJ SOLN: 25.0000 mg | Freq: Four times a day (QID) | INTRAMUSCULAR | Status: DC | PRN

## 2023-04-18 MED ORDER — METRONIDAZOLE 500 MG/100ML IV SOLN
500.0000 mg | Freq: Two times a day (BID) | INTRAVENOUS | Status: DC
Start: 2023-04-18 — End: 2023-04-18

## 2023-04-18 MED ORDER — OXYCODONE HCL 5 MG PO TABS
5.0000 mg | ORAL_TABLET | ORAL | Status: DC | PRN
  Administered 2023-04-18 – 2023-04-19 (×4): 10 mg via ORAL
  Filled 2023-04-18 (×4): qty 2

## 2023-04-18 MED ORDER — MIDAZOLAM HCL 5 MG/5ML IJ SOLN
INTRAMUSCULAR | Status: DC | PRN
  Administered 2023-04-18: 2 mg via INTRAVENOUS

## 2023-04-18 MED ORDER — KETOROLAC TROMETHAMINE 30 MG/ML IJ SOLN
INTRAMUSCULAR | Status: AC
  Filled 2023-04-18: qty 1

## 2023-04-18 MED ORDER — CHLORHEXIDINE GLUCONATE CLOTH 2 % EX PADS
6.0000 | MEDICATED_PAD | Freq: Once | CUTANEOUS | Status: DC
  Administered 2023-04-18: 6 via TOPICAL

## 2023-04-18 MED ORDER — ONDANSETRON HCL 4 MG/2ML IJ SOLN
INTRAMUSCULAR | Status: AC
Start: 2023-04-18 — End: ?
  Filled 2023-04-18: qty 2

## 2023-04-18 MED ORDER — SIMETHICONE 80 MG PO CHEW: 40.0000 mg | CHEWABLE_TABLET | Freq: Four times a day (QID) | ORAL | Status: DC | PRN

## 2023-04-18 MED ORDER — DEXAMETHASONE SODIUM PHOSPHATE 10 MG/ML IJ SOLN
INTRAMUSCULAR | Status: AC
Start: 2023-04-18 — End: ?
  Filled 2023-04-18: qty 1

## 2023-04-18 MED ORDER — PROPOFOL 10 MG/ML IV BOLUS
INTRAVENOUS | Status: DC | PRN
  Administered 2023-04-18: 150 mg via INTRAVENOUS

## 2023-04-18 MED ORDER — HYDROMORPHONE HCL 1 MG/ML IJ SOLN
1.0000 mg | INTRAMUSCULAR | Status: DC | PRN
  Administered 2023-04-18 – 2023-04-19 (×4): 1 mg via INTRAVENOUS
  Filled 2023-04-18 (×5): qty 1

## 2023-04-18 MED ORDER — SCOPOLAMINE 1 MG/3DAYS TD PT72
MEDICATED_PATCH | TRANSDERMAL | Status: AC
  Filled 2023-04-18: qty 1

## 2023-04-18 MED ORDER — ACETAMINOPHEN 650 MG RE SUPP: 650.0000 mg | Freq: Four times a day (QID) | RECTAL | Status: DC | PRN

## 2023-04-18 MED ORDER — NICOTINE 7 MG/24HR TD PT24
7.0000 mg | MEDICATED_PATCH | Freq: Every day | TRANSDERMAL | Status: DC
  Administered 2023-04-18: 7 mg via TRANSDERMAL
  Filled 2023-04-18: qty 1

## 2023-04-18 MED ORDER — BUPIVACAINE HCL (PF) 0.5 % IJ SOLN
INTRAMUSCULAR | Status: AC
  Filled 2023-04-18: qty 30

## 2023-04-18 MED ORDER — CHLORHEXIDINE GLUCONATE CLOTH 2 % EX PADS: 6.0000 | MEDICATED_PAD | Freq: Once | CUTANEOUS | Status: DC

## 2023-04-18 MED ORDER — CHLORHEXIDINE GLUCONATE 0.12 % MT SOLN: 15.0000 mL | Freq: Once | OROMUCOSAL | Status: AC

## 2023-04-18 MED ORDER — FENTANYL CITRATE (PF) 250 MCG/5ML IJ SOLN
INTRAMUSCULAR | Status: DC | PRN
  Administered 2023-04-18 (×3): 50 ug via INTRAVENOUS
  Administered 2023-04-18: 100 ug via INTRAVENOUS

## 2023-04-18 MED ORDER — ONDANSETRON HCL 4 MG/2ML IJ SOLN
4.0000 mg | Freq: Four times a day (QID) | INTRAMUSCULAR | Status: DC | PRN
Start: 2023-04-18 — End: 2023-04-18
  Administered 2023-04-18: 4 mg via INTRAVENOUS
  Filled 2023-04-18 (×2): qty 2

## 2023-04-18 MED ORDER — SODIUM CHLORIDE 0.9 % IV SOLN: INTRAVENOUS | Status: DC

## 2023-04-18 MED ORDER — STERILE WATER FOR IRRIGATION IR SOLN
Status: DC | PRN
  Administered 2023-04-18: 500 mL

## 2023-04-18 MED ORDER — ATORVASTATIN CALCIUM 40 MG PO TABS: 40.0000 mg | ORAL_TABLET | Freq: Every day | ORAL | Status: DC

## 2023-04-18 MED ORDER — ORAL CARE MOUTH RINSE
15.0000 mL | Freq: Once | OROMUCOSAL | Status: AC
Start: 2023-04-18 — End: 2023-04-18

## 2023-04-18 MED ORDER — FENTANYL CITRATE PF 50 MCG/ML IJ SOSY: 25.0000 ug | PREFILLED_SYRINGE | INTRAMUSCULAR | Status: DC | PRN

## 2023-04-18 MED ORDER — KETOROLAC TROMETHAMINE 30 MG/ML IJ SOLN
30.0000 mg | Freq: Four times a day (QID) | INTRAMUSCULAR | Status: DC | PRN
  Administered 2023-04-19: 30 mg via INTRAVENOUS
  Filled 2023-04-18: qty 1

## 2023-04-18 MED ORDER — ONDANSETRON HCL 4 MG/2ML IJ SOLN
4.0000 mg | Freq: Four times a day (QID) | INTRAMUSCULAR | Status: DC | PRN
  Administered 2023-04-18 – 2023-04-19 (×2): 4 mg via INTRAVENOUS
  Filled 2023-04-18 (×2): qty 2

## 2023-04-18 MED ORDER — MIDAZOLAM HCL 2 MG/2ML IJ SOLN
INTRAMUSCULAR | Status: AC
  Filled 2023-04-18: qty 2

## 2023-04-18 MED ORDER — FENTANYL CITRATE (PF) 250 MCG/5ML IJ SOLN
INTRAMUSCULAR | Status: AC
  Filled 2023-04-18: qty 5

## 2023-04-18 MED ORDER — ALBUTEROL SULFATE (2.5 MG/3ML) 0.083% IN NEBU: 3.0000 mL | INHALATION_SOLUTION | RESPIRATORY_TRACT | Status: DC | PRN

## 2023-04-18 MED ORDER — CEFAZOLIN SODIUM-DEXTROSE 2-4 GM/100ML-% IV SOLN
2.0000 g | INTRAVENOUS | Status: AC
  Administered 2023-04-18: 2 g via INTRAVENOUS

## 2023-04-18 MED ORDER — DIPHENHYDRAMINE HCL 25 MG PO CAPS: 25.0000 mg | ORAL_CAPSULE | Freq: Four times a day (QID) | ORAL | Status: DC | PRN

## 2023-04-18 MED ORDER — ONDANSETRON HCL 4 MG PO TABS: 4.0000 mg | ORAL_TABLET | Freq: Four times a day (QID) | ORAL | Status: DC | PRN

## 2023-04-18 MED ORDER — FENTANYL CITRATE PF 50 MCG/ML IJ SOSY
50.0000 ug | PREFILLED_SYRINGE | Freq: Once | INTRAMUSCULAR | Status: AC
  Administered 2023-04-18: 50 ug via INTRAVENOUS
  Filled 2023-04-18: qty 1

## 2023-04-18 MED ORDER — ACETAMINOPHEN 325 MG PO TABS
650.0000 mg | ORAL_TABLET | Freq: Four times a day (QID) | ORAL | Status: DC | PRN
Start: 2023-04-18 — End: 2023-04-18

## 2023-04-18 MED ORDER — LACTATED RINGERS IV SOLN: INTRAVENOUS | Status: DC | PRN

## 2023-04-18 MED ORDER — SODIUM CHLORIDE 0.9 % IR SOLN
Status: DC | PRN
  Administered 2023-04-18: 3000 mL

## 2023-04-18 MED ORDER — SCOPOLAMINE 1 MG/3DAYS TD PT72
1.0000 | MEDICATED_PATCH | Freq: Once | TRANSDERMAL | Status: DC
  Administered 2023-04-18: 1.5 mg via TRANSDERMAL

## 2023-04-18 MED ORDER — ROCURONIUM BROMIDE 10 MG/ML (PF) SYRINGE
PREFILLED_SYRINGE | INTRAVENOUS | Status: AC
  Filled 2023-04-18: qty 10

## 2023-04-18 MED ORDER — ENOXAPARIN SODIUM 40 MG/0.4ML IJ SOSY
40.0000 mg | PREFILLED_SYRINGE | INTRAMUSCULAR | Status: DC
  Administered 2023-04-19: 40 mg via SUBCUTANEOUS
  Filled 2023-04-18: qty 0.4

## 2023-04-18 MED ORDER — ONDANSETRON HCL 4 MG/2ML IJ SOLN
INTRAMUSCULAR | Status: DC | PRN
  Administered 2023-04-18: 4 mg via INTRAVENOUS

## 2023-04-18 MED ORDER — HYDROMORPHONE HCL 1 MG/ML IJ SOLN
1.0000 mg | INTRAMUSCULAR | Status: DC | PRN
  Administered 2023-04-18: 1 mg via INTRAVENOUS
  Filled 2023-04-18: qty 1

## 2023-04-18 MED ORDER — FENTANYL CITRATE PF 50 MCG/ML IJ SOSY
PREFILLED_SYRINGE | INTRAMUSCULAR | Status: AC
  Filled 2023-04-18: qty 1

## 2023-04-18 MED ORDER — IPRATROPIUM BROMIDE 0.02 % IN SOLN: 0.5000 mg | RESPIRATORY_TRACT | Status: DC | PRN

## 2023-04-18 MED ORDER — SUGAMMADEX SODIUM 200 MG/2ML IV SOLN
INTRAVENOUS | Status: DC | PRN
  Administered 2023-04-18: 200 mg via INTRAVENOUS

## 2023-04-18 MED ORDER — CEFAZOLIN SODIUM-DEXTROSE 2-4 GM/100ML-% IV SOLN
INTRAVENOUS | Status: AC
  Filled 2023-04-18: qty 100

## 2023-04-18 MED ORDER — FENTANYL CITRATE PF 50 MCG/ML IJ SOSY
50.0000 ug | PREFILLED_SYRINGE | INTRAMUSCULAR | Status: DC | PRN
  Administered 2023-04-18: 50 ug via INTRAVENOUS

## 2023-04-18 MED ORDER — OXYCODONE HCL 5 MG PO TABS
5.0000 mg | ORAL_TABLET | ORAL | Status: DC | PRN
Start: 2023-04-18 — End: 2023-04-18
  Administered 2023-04-18 (×2): 5 mg via ORAL
  Filled 2023-04-18 (×2): qty 1

## 2023-04-18 MED ORDER — BUPIVACAINE HCL (PF) 0.5 % IJ SOLN
INTRAMUSCULAR | Status: DC | PRN
  Administered 2023-04-18: 30 mL

## 2023-04-18 MED ORDER — PROPOFOL 10 MG/ML IV BOLUS
INTRAVENOUS | Status: AC
Start: 2023-04-18 — End: ?
  Filled 2023-04-18: qty 20

## 2023-04-18 MED ORDER — ACETAMINOPHEN 650 MG RE SUPP
650.0000 mg | Freq: Four times a day (QID) | RECTAL | Status: DC | PRN
Start: 2023-04-18 — End: 2023-04-18

## 2023-04-18 MED ORDER — LIDOCAINE 2% (20 MG/ML) 5 ML SYRINGE
INTRAMUSCULAR | Status: DC | PRN
  Administered 2023-04-18: 80 mg via INTRAVENOUS

## 2023-04-18 MED ORDER — KETOROLAC TROMETHAMINE 30 MG/ML IJ SOLN
30.0000 mg | Freq: Four times a day (QID) | INTRAMUSCULAR | Status: AC
  Administered 2023-04-18: 30 mg via INTRAVENOUS
  Filled 2023-04-18: qty 1

## 2023-04-18 MED ORDER — LIDOCAINE HCL (PF) 2 % IJ SOLN
INTRAMUSCULAR | Status: AC
Start: 2023-04-18 — End: ?
  Filled 2023-04-18: qty 5

## 2023-04-18 MED ORDER — KETOROLAC TROMETHAMINE 30 MG/ML IJ SOLN
INTRAMUSCULAR | Status: DC | PRN
  Administered 2023-04-18: 30 mg via INTRAVENOUS

## 2023-04-18 MED ORDER — CHLORHEXIDINE GLUCONATE 0.12 % MT SOLN
OROMUCOSAL | Status: AC
  Administered 2023-04-18: 15 mL via OROMUCOSAL
  Filled 2023-04-18: qty 15

## 2023-04-18 MED ORDER — SODIUM CHLORIDE 0.9 % IV SOLN
2.0000 g | INTRAVENOUS | Status: DC
  Administered 2023-04-18 – 2023-04-19 (×2): 2 g via INTRAVENOUS
  Filled 2023-04-18 (×2): qty 20

## 2023-04-18 MED ORDER — ROCURONIUM BROMIDE 10 MG/ML (PF) SYRINGE
PREFILLED_SYRINGE | INTRAVENOUS | Status: DC | PRN
  Administered 2023-04-18: 10 mg via INTRAVENOUS
  Administered 2023-04-18: 50 mg via INTRAVENOUS

## 2023-04-18 MED ORDER — ACETAMINOPHEN 325 MG PO TABS: 650.0000 mg | ORAL_TABLET | Freq: Four times a day (QID) | ORAL | Status: DC | PRN

## 2023-04-18 MED ORDER — SUCCINYLCHOLINE CHLORIDE 200 MG/10ML IV SOSY
PREFILLED_SYRINGE | INTRAVENOUS | Status: AC
Start: 2023-04-18 — End: ?
  Filled 2023-04-18: qty 10

## 2023-04-18 MED ORDER — MORPHINE SULFATE (PF) 2 MG/ML IV SOLN
2.0000 mg | INTRAVENOUS | Status: DC | PRN
  Administered 2023-04-18: 2 mg via INTRAVENOUS
  Filled 2023-04-18: qty 1

## 2023-04-18 MED ORDER — ONDANSETRON 4 MG PO TBDP: 4.0000 mg | ORAL_TABLET | Freq: Four times a day (QID) | ORAL | Status: DC | PRN

## 2023-04-18 SURGICAL SUPPLY — 47 items
ADH SKN CLS APL DERMABOND .7 (GAUZE/BANDAGES/DRESSINGS) ×1
APL PRP STRL LF DISP 70% ISPRP (MISCELLANEOUS) ×1
CHLORAPREP W/TINT 26 (MISCELLANEOUS) ×1 IMPLANT
COVER MAYO STAND XLG (MISCELLANEOUS) ×1 IMPLANT
DERMABOND ADVANCED .7 DNX12 (GAUZE/BANDAGES/DRESSINGS) ×1 IMPLANT
DRAPE ARM DVNC X/XI (DISPOSABLE) ×3 IMPLANT
DRAPE COLUMN DVNC XI (DISPOSABLE) ×1 IMPLANT
ELECT REM PT RETURN 9FT ADLT (ELECTROSURGICAL) ×1
ELECTRODE REM PT RTRN 9FT ADLT (ELECTROSURGICAL) ×1 IMPLANT
FORCEPS BPLR FENES DVNC XI (FORCEP) ×1 IMPLANT
GAUZE 4X4 16PLY ~~LOC~~+RFID DBL (SPONGE) ×1 IMPLANT
GLOVE BIO SURGEON STRL SZ7.5 (GLOVE) IMPLANT
GLOVE BIOGEL PI IND STRL 7.0 (GLOVE) ×2 IMPLANT
GLOVE BIOGEL PI IND STRL 7.5 (GLOVE) IMPLANT
GLOVE BIOGEL PI IND STRL 8 (GLOVE) IMPLANT
GLOVE SURG SS PI 7.5 STRL IVOR (GLOVE) ×2 IMPLANT
GLOVE SURG SS PI 8.0 STRL IVOR (GLOVE) IMPLANT
GOWN STRL REUS W/TWL LRG LVL3 (GOWN DISPOSABLE) ×3 IMPLANT
IRRIGATOR SUCT 8 DISP DVNC XI (IRRIGATION / IRRIGATOR) IMPLANT
IV NS IRRIG 3000ML ARTHROMATIC (IV SOLUTION) IMPLANT
KIT PINK PAD W/HEAD ARE REST (MISCELLANEOUS) ×1 IMPLANT
KIT PINK PAD W/HEAD ARM REST (MISCELLANEOUS) ×1 IMPLANT
KIT TURNOVER KIT A (KITS) ×1 IMPLANT
MANIFOLD NEPTUNE II (INSTRUMENTS) ×1 IMPLANT
NDL HYPO 21X1.5 SAFETY (NEEDLE) ×1 IMPLANT
NDL INSUFFLATION 14GA 120MM (NEEDLE) ×1 IMPLANT
NEEDLE HYPO 21X1.5 SAFETY (NEEDLE) ×1 IMPLANT
NEEDLE INSUFFLATION 14GA 120MM (NEEDLE) ×1 IMPLANT
NS IRRIG 500ML POUR BTL (IV SOLUTION) ×1 IMPLANT
OBTURATOR OPTICAL STND 8 DVNC (TROCAR) ×1
OBTURATOR OPTICALSTD 8 DVNC (TROCAR) ×1 IMPLANT
PACK LAP CHOLE LZT030E (CUSTOM PROCEDURE TRAY) ×1 IMPLANT
PAD ARMBOARD 7.5X6 YLW CONV (MISCELLANEOUS) ×1 IMPLANT
PENCIL HANDSWITCHING (ELECTRODE) ×1 IMPLANT
POSITIONER HEAD 8X9X4 ADT (SOFTGOODS) ×1 IMPLANT
RELOAD STAPLE 45 3.5 BLU DVNC (STAPLE) IMPLANT
RELOAD STAPLER 3.5X45 BLU DVNC (STAPLE) ×1 IMPLANT
SEAL UNIV 5-12 XI (MISCELLANEOUS) ×2 IMPLANT
SEALER VESSEL EXT DVNC XI (MISCELLANEOUS) IMPLANT
SET TUBE DA VINCI INSUFFLATOR (TUBING) IMPLANT
STAPLER 45 SUREFORM DVNC (STAPLE) ×1 IMPLANT
STAPLER RELOAD 3.5X45 BLU DVNC (STAPLE) ×1
SUT MNCRL AB 4-0 PS2 18 (SUTURE) ×1 IMPLANT
SYS BAG RETRIEVAL 10MM (BASKET) ×1
SYSTEM BAG RETRIEVAL 10MM (BASKET) IMPLANT
TAPE TRANSPORE STRL 2 31045 (GAUZE/BANDAGES/DRESSINGS) ×1 IMPLANT
WATER STERILE IRR 500ML POUR (IV SOLUTION) ×1 IMPLANT

## 2023-04-18 NOTE — Assessment & Plan Note (Signed)
-   Textbook appendicitis with periumbilical pain that moved to the right lower quadrant, leukocytosis, rebound tenderness - CT shows acute appendicitis - Cefepime and Flagyl started in the ED - Continue Rocephin and Flagyl - GEN surge consulted and plans to see patient in the a.m. - N.p.o. - Continue analgesics as needed

## 2023-04-18 NOTE — Assessment & Plan Note (Signed)
-   Patient reports she has been on 10 mg of oxycodone for a decade - Her pain is can to be harder to control because of this - One-time dose of fentanyl ordered - Morphine every 2 hours - Encouraged her to take oxycodone because it is longer acting in between, but she thinks the milligrams are too low to do her any good - Patient was asleep when I entered the room so the pain is somewhat controlled

## 2023-04-18 NOTE — OR Nursing (Signed)
Complaints of pain of level  7, Fentynal  given per order.

## 2023-04-18 NOTE — Progress Notes (Signed)
Patient returned from PACU in stable condition , she is alert and oriented x 4. Port sites intact no drainage noted. SCD's on . Fluids restarted per order   04/18/23 1535  Vitals  Temp 98.4 F (36.9 C)  Temp Source Oral  BP 120/66  MAP (mmHg) 83  Pulse Rate (!) 108  Pulse Rate Source Dinamap  Resp 20  Level of Consciousness  Level of Consciousness Alert  MEWS COLOR  MEWS Score Color Green  Oxygen Therapy  SpO2 95 %  O2 Device Room Air  MEWS Score  MEWS Temp 0  MEWS Systolic 0  MEWS Pulse 1  MEWS RR 0  MEWS LOC 0  MEWS Score 1

## 2023-04-18 NOTE — Op Note (Signed)
Patient:  Angel Tapia  DOB:  10-12-1965  MRN:  [id number]   Preop Diagnosis: Acute appendicitis  Postop Diagnosis: Gangrenous appendicitis  Procedure: Robotic assisted laparoscopic appendectomy  Surgeon: Franky Macho, MD  Anes: General Endotracheal  Indications: Patient is a 57 year old white female who presented emergency room with right lower quadrant abdominal pain.  CT scan of the abdomen reveals acute appendicitis.  The risks and benefits of the procedure including bleeding, infection, and the possibility of an open procedure were fully explained to the patient, who gave informed consent.  Procedure note: The patient was placed in the supine position.  After induction of general endotracheal anesthesia, the abdomen was prepped and draped using usual sterile technique with ChloraPrep.  Surgical site confirmation was performed.  An incision was made at Palmer's point in the left upper quadrant.  A Veress needle was introduced into the abdominal cavity and confirmation of placement was done using the saline drop test.  The abdomen was then insufflated to 15 mmHg pressure.  An 8 mm trocar was introduced into the abdominal cavity under direct visualization without difficulty.  Additional 8 mm trocars were placed in the left flank and left lower quadrant regions.  The left upper quadrant 8 mm trocar site was upsized to a 12 mm trocar.  The robot was then docked and targeted.  The appendix was noted to be gangrenous with evidence of early perforation when the surrounding adipose tissue was reflected away from it.  The mesoappendix was divided using the vessel sealer.  I was able to identify the juncture of the appendix to the cecum.  That tissue appeared to be within normal limits.  A blue load Endo GIA was placed across the base the appendix and fired.  The appendix was then removed using an Endo Catch bag without difficulty.  The staple line was inspected noted to be intact.  The right lower  quadrant was copiously irrigated with normal saline.  All fluid and air were then evacuated from the abdominal cavity.  The robot was undocked and all trocars were removed without difficulty.  All wounds were irrigated with normal saline.  All wounds were injected with 0.5% Sensorcaine.  All incisions were closed using a 4-0 Monocryl subcuticular suture.  Dermabond was applied.  All tape and needle counts were correct at the end of the procedure.  The patient was extubated in the operating room and transferred to PACU in stable condition.  Complications: None  EBL: Minimal  Specimen: Appendix

## 2023-04-18 NOTE — Progress Notes (Signed)
   04/18/23 1535  Incision - 3 Ports Abdomen Left;Upper Left;Mid Left;Lower  Placement Date/Time: 04/18/23 1400   Location of Ports: Abdomen  Location Orientation: Left;Upper  Location Orientation: Left;Mid  Location Orientation: Left;Lower  Port 1 Site Assessment Clean;Dry  Port 1 Margins Attached edges (approximated)  Port 1 Drainage Amount None  Port 1 Dressing Type Liquid skin adhesive  Port 2 Site Assessment Clean  Port 2 Margins Attached edges (approximated)  Port 2 Drainage Amount None  Port 2 Dressing Type Liquid skin adhesive  Port 3 Site Assessment Clean  Port 3 Margins Attached edges (approximated)  Port 3 Dressing Type Liquid skin adhesive  Neurological  Level of Consciousness Alert

## 2023-04-18 NOTE — Progress Notes (Signed)
   04/18/23 1008  TOC Brief Assessment  Insurance and Status Reviewed  Patient has primary care physician Yes  Home environment has been reviewed Home  Prior level of function: independent  Prior/Current Home Services No current home services  Social Determinants of Health Reivew SDOH reviewed no interventions necessary  Readmission risk has been reviewed Yes  Transition of care needs no transition of care needs at this time   [facility] consulted, OR later today, TOC following, No needs anticipated.

## 2023-04-18 NOTE — H&P (View-Only) (Signed)
Reason for Consult: Appendicitis Referring Physician: Dr. Victorino Dike, DO  Angel Tapia is an 57 y.o. female.  HPI: Patient seen and evaluated while lying in bed. Patient is a 57 y.o. female with a past medical history significant for chronic pain and tobacco use disorder who presented to the ED with worsening abdominal pain. She reports about 3-4 days ago experiencing periumbilical pain that localized to the RLQ that night. She describes the pain as sharp and burning and rates current pain as a 7.5-8/10. She endorses coughing and movement makes pain worse. Endorses nausea, 3-4 episodes of vomiting and denies diarrhea or changes in bowel movements or urination.   Of note, patient reports experiencing a sinus infection for the past 2 weeks and taking sudafed and tylenol sinus.   Past Medical History:  Diagnosis Date   Carpal tunnel syndrome    DDD (degenerative disc disease), cervical    DDD (degenerative disc disease), lumbar    Past Surgical History:  Procedure Laterality Date   OOPHORECTOMY     OVARIAN CYST REMOVAL     History reviewed. No pertinent family history.  Social History:  reports that she has been smoking. She has never used smokeless tobacco. She reports that she does not drink alcohol and does not use drugs.  Allergies:  Allergies  Allergen Reactions   Prednisone Palpitations    Moody, Angry   Gabapentin (Once-Daily) Anxiety   Medications: I have reviewed the patient's current medications. Prior to Admission:  Medications Prior to Admission  Medication Sig Dispense Refill Last Dose   albuterol (PROVENTIL HFA;VENTOLIN HFA) 108 (90 Base) MCG/ACT inhaler Inhale 2 puffs into the lungs every 4 (four) hours as needed for wheezing or shortness of breath. 1 Inhaler 0    benzonatate (TESSALON) 100 MG capsule Take 1 capsule (100 mg total) by mouth 3 (three) times daily as needed for cough. 15 capsule 0    clindamycin (CLEOCIN) 300 MG capsule 1 po tid with food  (Patient not taking: Reported on 08/21/2016) 21 capsule 0    dexamethasone (DECADRON) 4 MG tablet Take 1 tablet (4 mg total) by mouth 2 (two) times daily with a meal. (Patient not taking: Reported on 08/21/2016) 12 tablet 0    fluticasone (FLONASE) 50 MCG/ACT nasal spray Place 2 sprays into both nostrils daily.      ibuprofen (ADVIL,MOTRIN) 600 MG tablet Take 1 tablet (600 mg total) by mouth every 6 (six) hours as needed. 30 tablet 0    loratadine-pseudoephedrine (CLARITIN-D 12 HOUR) 5-120 MG tablet Take 1 tablet by mouth 2 (two) times daily. 20 tablet 0    oxyCODONE-acetaminophen (PERCOCET) 10-325 MG tablet Take 1 tablet by mouth every 6 (six) hours as needed for pain.      tiZANidine (ZANAFLEX) 4 MG tablet Take 4 mg by mouth at bedtime as needed and may repeat dose one time if needed for muscle spasms.      Scheduled:  nicotine  7 mg Transdermal Daily   Continuous:  sodium chloride 100 mL/hr at 04/18/23 0747   cefTRIAXone (ROCEPHIN)  IV 2 g (04/18/23 0610)   metronidazole     UJW:JXBJYNWGNFAOZ **OR** acetaminophen, HYDROmorphone (DILAUDID) injection, ondansetron **OR** ondansetron (ZOFRAN) IV, oxyCODONE  Results for orders placed or performed during the hospital encounter of 04/17/23 (from the past 48 hour(s))  Blood culture (routine x 2)     Status: None (Preliminary result)   Collection Time: 04/17/23  9:53 PM   Specimen: BLOOD LEFT ARM  Result Value Ref  Range   Specimen Description BLOOD LEFT ARM    Special Requests      Normal BOTTLES DRAWN AEROBIC AND ANAEROBIC Blood Culture adequate volume   Culture      NO GROWTH < 12 HOURS Performed at [facility], [address], [place], [place] [address]    Report Status PENDING   Lipase, blood     Status: None   Collection Time: 04/17/23 10:00 PM  Result Value Ref Range   Lipase 25 11 - 51 U/L    Comment: Performed at [facility], [address], [place], [place] [address]  Comprehensive metabolic panel     Status: Abnormal    Collection Time: 04/17/23 10:00 PM  Result Value Ref Range   Sodium 137 135 - 145 mmol/L   Potassium 4.2 3.5 - 5.1 mmol/L   Chloride 101 98 - 111 mmol/L   CO2 21 (L) 22 - 32 mmol/L   Glucose, Bld 117 (H) 70 - 99 mg/dL    Comment: Glucose reference range applies only to samples taken after fasting for at least 8 hours.   BUN 11 6 - 20 mg/dL   Creatinine, Ser 9.62 0.44 - 1.00 mg/dL   Calcium 9.9 8.9 - 95.2 mg/dL   Total Protein 8.7 (H) 6.5 - 8.1 g/dL   Albumin 4.5 3.5 - 5.0 g/dL   AST 22 15 - 41 U/L   ALT 33 0 - 44 U/L   Alkaline Phosphatase 199 (H) 38 - 126 U/L   Total Bilirubin 0.5 0.3 - 1.2 mg/dL   GFR, Estimated >84 >13 mL/min    Comment: (NOTE) Calculated using the CKD-EPI Creatinine Equation (2021)    Anion gap 15 5 - 15    Comment: Performed at [facility], [address] [place], [place] [address]  CBC     Status: Abnormal   Collection Time: 04/17/23 10:00 PM  Result Value Ref Range   WBC 18.5 (H) 4.0 - 10.5 K/uL   RBC 5.55 (H) 3.87 - 5.11 MIL/uL   Hemoglobin 14.9 12.0 - 15.0 g/dL   HCT 02.7 (H) 25.3 - 66.4 %   MCV 83.1 80.0 - 100.0 fL   MCH 26.8 26.0 - 34.0 pg   MCHC 32.3 30.0 - 36.0 g/dL   RDW 40.3 (H) 47.4 - 25.9 %   Platelets 492 (H) 150 - 400 K/uL   nRBC 0.0 0.0 - 0.2 %    Comment: Performed at [facility], [address] [place], [place] [address]  Lactic acid, plasma     Status: None   Collection Time: 04/17/23 10:00 PM  Result Value Ref Range   Lactic Acid, Venous 1.6 0.5 - 1.9 mmol/L    Comment: Performed at [facility], [address], [place], [place] [address]  Blood culture (routine x 2)     Status: None (Preliminary result)   Collection Time: 04/17/23 10:00 PM   Specimen: Right Antecubital; Blood  Result Value Ref Range   Specimen Description RIGHT ANTECUBITAL    Special Requests      Normal BOTTLES DRAWN AEROBIC AND ANAEROBIC Blood Culture results may not be optimal due to an excessive volume of blood received in culture bottles   Culture       NO GROWTH < 12 HOURS Performed at [facility], [address], [place], [place] [address]    Report Status PENDING   Comprehensive metabolic panel     Status: Abnormal   Collection Time: 04/18/23  4:35 AM  Result  Value Ref Range   Sodium 136 135 - 145 mmol/L   Potassium 4.0 3.5 - 5.1 mmol/L   Chloride 99 98 - 111 mmol/L   CO2 27 22 - 32 mmol/L   Glucose, Bld 115 (H) 70 - 99 mg/dL    Comment: Glucose reference range applies only to samples taken after fasting for at least 8 hours.   BUN 9 6 - 20 mg/dL   Creatinine, Ser 8.29 0.44 - 1.00 mg/dL   Calcium 9.1 8.9 - 56.2 mg/dL   Total Protein 7.6 6.5 - 8.1 g/dL   Albumin 3.8 3.5 - 5.0 g/dL   AST 16 15 - 41 U/L   ALT 27 0 - 44 U/L   Alkaline Phosphatase 167 (H) 38 - 126 U/L   Total Bilirubin 0.5 0.3 - 1.2 mg/dL   GFR, Estimated >13 >08 mL/min    Comment: (NOTE) Calculated using the CKD-EPI Creatinine Equation (2021)    Anion gap 10 5 - 15    Comment: Performed at [facility], [address] [place], [place] [address]  Magnesium     Status: None   Collection Time: 04/18/23  4:35 AM  Result Value Ref Range   Magnesium 1.9 1.7 - 2.4 mg/dL    Comment: Performed at [facility], [address] [place], [place] [address]  CBC with Differential/Platelet     Status: Abnormal   Collection Time: 04/18/23  4:35 AM  Result Value Ref Range   WBC 18.1 (H) 4.0 - 10.5 K/uL   RBC 5.25 (H) 3.87 - 5.11 MIL/uL   Hemoglobin 13.9 12.0 - 15.0 g/dL   HCT 52.8 41.3 - 24.4 %   MCV 84.2 80.0 - 100.0 fL   MCH 26.5 26.0 - 34.0 pg   MCHC 31.4 30.0 - 36.0 g/dL   RDW 01.0 (H) 27.2 - 53.6 %   Platelets 436 (H) 150 - 400 K/uL   nRBC 0.0 0.0 - 0.2 %   Neutrophils Relative % 76 %   Neutro Abs 13.6 (H) 1.7 - 7.7 K/uL   Lymphocytes Relative 12 %   Lymphs Abs 2.1 0.7 - 4.0 K/uL   Monocytes Relative 12 %   Monocytes Absolute 2.1 (H) 0.1 - 1.0 K/uL   Eosinophils Relative 0 %   Eosinophils Absolute 0.1 0.0 - 0.5 K/uL   Basophils Relative 0 %   Basophils  Absolute 0.1 0.0 - 0.1 K/uL   Immature Granulocytes 0 %   Abs Immature Granulocytes 0.08 (H) 0.00 - 0.07 K/uL    Comment: Performed at [facility], [address], [place], [place] [address]  Urinalysis, Routine w reflex microscopic -Urine, Clean Catch     Status: Abnormal   Collection Time: 04/18/23  4:36 AM  Result Value Ref Range   Color, Urine YELLOW YELLOW   APPearance CLEAR CLEAR   Specific Gravity, Urine 1.034 (H) 1.005 - 1.030   pH 6.0 5.0 - 8.0   Glucose, UA NEGATIVE NEGATIVE mg/dL   Hgb urine dipstick SMALL (A) NEGATIVE   Bilirubin Urine NEGATIVE NEGATIVE   Ketones, ur NEGATIVE NEGATIVE mg/dL   Protein, ur NEGATIVE NEGATIVE mg/dL   Nitrite NEGATIVE NEGATIVE   Leukocytes,Ua NEGATIVE NEGATIVE   RBC / HPF 0-5 0 - 5 RBC/hpf   WBC, UA 0-5 0 - 5 WBC/hpf   Bacteria, UA RARE (A) NONE SEEN   Squamous Epithelial / HPF 0-5 0 - 5 /HPF   Mucus PRESENT     Comment: Performed at [facility], [address], [place], [place] [address]   CT ABDOMEN PELVIS W CONTRAST  Result Date: 04/17/2023 CLINICAL DATA:  Right lower quadrant pain for several days EXAM: CT ABDOMEN AND PELVIS WITH CONTRAST TECHNIQUE: Multidetector CT imaging of the abdomen and pelvis was performed using the standard protocol following bolus administration of intravenous contrast. RADIATION DOSE REDUCTION: This exam was performed according to the departmental dose-optimization program which includes automated exposure control, adjustment of the mA and/or kV according to patient size and/or use of iterative reconstruction technique. CONTRAST:  OMNIPAQUE IOHEXOL 300 MG/ML  SOLN COMPARISON:  03/23/2014 FINDINGS: Lower chest: No acute abnormality. Hepatobiliary: Gallbladder is well distended. No cholelithiasis is noted. Liver is within normal limits with the exception of mild biliary ductal dilatation. Filling defect is noted in the distal common bile duct causing the obstructive change. Common bile duct measures approximately  9 mm. Pancreas: Pancreas is within normal limits with the exception of the gallstone in the distal aspect of the common bile duct. Spleen: Normal in size without focal abnormality. Adrenals/Urinary Tract: Adrenal glands are within normal limits. Kidneys demonstrate a normal enhancement pattern bilaterally. No renal calculi or obstructive changes are seen. The bladder is well distended. Stomach/Bowel: Diverticular change of the colon is noted without evidence of diverticulitis. No obstructive changes of the colon are seen. The appendix is well visualized and dilated. Mild hyperemia is noted in the mucosa with Peri appendiceal inflammatory changes consistent with early appendicitis. No findings to suggest perforation are noted. Small bowel and stomach are within normal limits. Vascular/Lymphatic: Aortic atherosclerosis. No enlarged abdominal or pelvic lymph nodes. Reproductive: Uterus and bilateral adnexa are unremarkable. Changes of prior tubal ligation are noted. Other: No abdominal wall hernia or abnormality. No abdominopelvic ascites. Musculoskeletal: No acute or significant osseous findings. IMPRESSION: Changes consistent with acute appendicitis without evidence of perforation. Findings consistent with a filling defect in the distal common bile duct consistent with choledocholithiasis with biliary ductal dilatation. Electronically Signed   By: Alcide Clever M.D.   On: 04/17/2023 23:20    ROS:  Pertinent items are noted in HPI.  Blood pressure 137/74, pulse (!) 116, temperature 99 F (37.2 C), temperature source Oral, resp. rate 19, height 5\' 3"  (1.6 m), weight 88.9 kg, SpO2 94%. Physical Exam Cardiovascular:     Rate and Rhythm: Normal rate.  Pulmonary:     Effort: Pulmonary effort is normal.  Abdominal:     Tenderness: There is abdominal tenderness in the right upper quadrant and right lower quadrant. There is guarding.  Neurological:     Mental Status: She is alert.   Assessment/Plan: Patient  is a 57 y.o. female presenting with RLQ pain concerning for appendicitis.   - CT scan shows findings consistent with early appendicitis. No findings to suggest perforation were noted - Patient scheduled for appendectomy this afternoon - Continue IV antibiotics  - Dilaudid for pain control  Bubba Hales 04/18/2023, 8:18 AM

## 2023-04-18 NOTE — Assessment & Plan Note (Signed)
-   Patient reports smoking 5-10 cigarettes a day - Nicotine patch ordered - Counseled on importance of cessation

## 2023-04-18 NOTE — Assessment & Plan Note (Signed)
-   Abnormality of the common bile duct seen on CT - GEN surge thinks it is more chronic - Plan is to first do appendectomy and then later workup the common bile duct

## 2023-04-18 NOTE — OR Nursing (Signed)
Continues  to rate pain a 7 . No relief

## 2023-04-18 NOTE — Interval H&P Note (Signed)
History and Physical Interval Note:  04/18/2023 12:17 PM  Angel Tapia  has presented today for surgery, with the diagnosis of acute appendicitis.  The various methods of treatment have been discussed with the patient and family. After consideration of risks, benefits and other options for treatment, the patient has consented to  Procedure(s): XI ROBOTIC LAPAROSCOPIC ASSISTED APPENDECTOMY (N/A) as a surgical intervention.  The patient's history has been reviewed, patient examined, no change in status, stable for surgery.  I have reviewed the patient's chart and labs.  Questions were answered to the patient's satisfaction.     Franky Macho

## 2023-04-18 NOTE — Progress Notes (Signed)
Progress Note   Patient: Angel Tapia WUJ:[id number] DOB: [birth year] DOA: 04/17/2023     0 DOS: the patient was seen and examined on 04/18/2023   Brief hospital course: 57yo with h/o chronic pain and tobacco dependence who presented on 10/24 with abdominal pain.  Imaging showed acute appendicitis.  She was started on Cefepime -> Ceftriaxone and Flagyl and general surgery will consult.  Assessment and Plan:   Acute appendicitis -Textbook appendicitis with periumbilical pain that moved to the right lower quadrant, leukocytosis, rebound tenderness -CT shows acute appendicitis -Cefepime and Flagyl started in the ED -> Ceftriaxone and metronidazole -Surgery consulted, to OR today -Continue analgesics as needed - she is on chronic opiates and so is likely to have increased pain needs compared to most   Chronic pain -Patient reports she has been on 10 mg of oxycodone for a decade -Her pain is can to be harder to control because of this -One-time dose of fentanyl ordered - Morphine every 2 hours -> Dilaudid   Common bile duct dilation -Abnormality of the common bile duct seen on CT -GEN surge thinks it is more chronic -Plan is to first do appendectomy and then later workup the common bile duct  HLD -Continue atorvastatin   Tobacco use disorder -Patient reports smoking 5-10 cigarettes a day -Nicotine patch ordered -Counseled on importance of cessation  Obesity -Body mass index is 34.72 kg/m..  -Weight loss should be encouraged -Outpatient PCP/bariatric medicine/bariatric surgery f/u encouraged     Consultants: Surgery  Procedures: Appendectomy 10/25  Antibiotics: Cefepime x 1 Metronidazole 10/24- Ceftriaxone 10/25-  30 Day Unplanned Readmission Risk Score    Flowsheet Row ED to Hosp-Admission (Current) from 04/17/2023 in [facility] SURGICAL UNIT  30 Day Unplanned Readmission Risk Score (%) 5.73 Filed at 04/18/2023 0401       This score is the patient's  risk of an unplanned readmission within 30 days of being discharged (0 -100%). The score is based on dignosis, age, lab data, medications, orders, and past utilization.   Low:  0-14.9   Medium: 15-21.9   High: 22-29.9   Extreme: 30 and above           Subjective: She is miserable this AM.  Pain is poorly controlled - likely a result of acute on chronic pain.  She is scheduled for lap appy this afternoon.   Objective: Vitals:   04/18/23 1022 04/18/23 1208  BP: 118/76 120/78  Pulse: (!) 122 (!) 119  Resp: (!) 22 20  Temp: 98 F (36.7 C) (!) 102.6 F (39.2 C)  SpO2: 93% 95%    Intake/Output Summary (Last 24 hours) at 04/18/2023 1417 Last data filed at 04/18/2023 1348 Gross per 24 hour  Intake 600 ml  Output --  Net 600 ml   Filed Weights   04/17/23 2133  Weight: 88.9 kg    Exam:  General:  Appears miserable - very uncomfortable, tearful, sitting on the side of the bed Eyes:  EOMI, normal lids, iris ENT:  grossly normal hearing, lips & tongue, mmm Neck:  no LAD, masses or thyromegaly Cardiovascular:  RRR, no m/r/g. No LE edema.  Respiratory:   CTA bilaterally with no wheezes/rales/rhonchi.  Normal respiratory effort. Abdomen:  diffusely tender - examination deferred due to known acute abdomen Skin:  no rash or induration seen on limited exam Musculoskeletal:  grossly normal tone BUE/BLE, good ROM, no bony abnormality Psychiatric:  blunted/labile mood and affect, speech fluent and appropriate, AOx3 Neurologic:  CN  2-12 grossly intact, moves all extremities in coordinated fashion  Data Reviewed: I have reviewed the patient's lab results since admission.  Pertinent labs for today include:   Glucose 115 AP 167 WBC 18.1 Platelets 436     Family Communication: None present; patient declined to have me call family  Disposition: Status is: Inpatient Remains inpatient appropriate because: needs surgery    Unresulted Labs (From admission, onward)     Start      Ordered   04/19/23 0500  CBC with Differential/Platelet  Tomorrow morning,   R        04/18/23 0935   04/19/23 0500  Comprehensive metabolic panel  Tomorrow morning,   R        04/18/23 0935             Author: Jonah Blue, MD 04/18/2023 2:17 PM  For on call review [email or web address].

## 2023-04-18 NOTE — H&P (Signed)
History and Physical    Patient: Angel Tapia UEA:[id number] DOB: 02/08/1966 DOA: 04/17/2023 DOS: the patient was seen and examined on 04/18/2023 PCP: [facility]  Patient coming from: Home  Chief Complaint:  Chief Complaint  Patient presents with   Abdominal Pain   HPI: Angel Tapia is a 57 y.o. female with medical history significant of tobacco use disorder, chronic pain, and more presents the ED with a chief complaint of abdominal pain.  Patient reports that it started Tuesday and it was periumbilical at that time.  It then moved to the right lower quadrant that night.  He continued hurting all through the day Wednesday and Thursday.  Patient reports that it is sharp pain but burning radiation up into her stomach.  She has had associated nausea and vomiting.  She reports she has vomited multiple times a day because she has not been able to keep anything down.  She denies hematemesis.  She has had diarrhea, but denies hematochezia and melena.  Her last meal was Sunday or Monday.  She has had a subjective fever at home.  She denies dysuria, chest pain, shortness of breath, rashes.  She reports that she has never had trouble with her GI system before, she is very regular, still has her gallbladder, this is very abnormal for her to have abdominal pain.  On review of systems she does report that she recently had an upper respiratory infection with postnasal drip, cough, some wheezing.  She was taking Sudafed for it.  It is mostly resolved at this point.  Patient does smoke 5 to 10 cigarettes/day.  She does not drink alcohol.  She is full code. Review of Systems: As mentioned in the history of present illness. All other systems reviewed and are negative. Past Medical History:  Diagnosis Date   Carpal tunnel syndrome    DDD (degenerative disc disease), cervical    DDD (degenerative disc disease), lumbar    Past Surgical History:  Procedure Laterality Date   OOPHORECTOMY      OVARIAN CYST REMOVAL     Social History:  reports that she has been smoking. She has never used smokeless tobacco. She reports that she does not drink alcohol and does not use drugs.  Allergies  Allergen Reactions   Prednisone Palpitations    Moody, Angry   Gabapentin (Once-Daily) Anxiety    History reviewed. No pertinent family history.  Prior to Admission medications   Medication Sig Start Date End Date Taking? Authorizing Provider  albuterol (PROVENTIL HFA;VENTOLIN HFA) 108 (90 Base) MCG/ACT inhaler Inhale 2 puffs into the lungs every 4 (four) hours as needed for wheezing or shortness of breath. 08/21/16   Samuel Jester, DO  benzonatate (TESSALON) 100 MG capsule Take 1 capsule (100 mg total) by mouth 3 (three) times daily as needed for cough. 08/21/16   Samuel Jester, DO  clindamycin (CLEOCIN) 300 MG capsule 1 po tid with food Patient not taking: Reported on 08/21/2016 08/12/16   Ivery Quale, PA-C  dexamethasone (DECADRON) 4 MG tablet Take 1 tablet (4 mg total) by mouth 2 (two) times daily with a meal. Patient not taking: Reported on 08/21/2016 08/12/16   Ivery Quale, PA-C  fluticasone [facility]) 50 MCG/ACT nasal spray Place 2 sprays into both nostrils daily.    [provider]  ibuprofen (ADVIL,MOTRIN) 600 MG tablet Take 1 tablet (600 mg total) by mouth every 6 (six) hours as needed. 08/12/16   Ivery Quale, PA-C  loratadine-pseudoephedrine (CLARITIN-D 12 HOUR) 5-120  MG tablet Take 1 tablet by mouth 2 (two) times daily. 08/12/16   Ivery Quale, PA-C  oxyCODONE-acetaminophen (PERCOCET) 10-325 MG tablet Take 1 tablet by mouth every 6 (six) hours as needed for pain.    [provider]  tiZANidine (ZANAFLEX) 4 MG tablet Take 4 mg by mouth at bedtime as needed and may repeat dose one time if needed for muscle spasms.    [provider]    Physical Exam: Vitals:   04/18/23 0028 04/18/23 0030 04/18/23 0140 04/18/23 0235  BP:  (!) 144/93 (!) 148/91 (!)  140/80  Pulse:  (!) 109 (!) 119 (!) 106  Resp: (!) 25 20 20 20   Temp:  98.9 F (37.2 C) 98.4 F (36.9 C) 98.9 F (37.2 C)  TempSrc:  Oral    SpO2:  94% 97% 96%  Weight:      Height:       1.  General: Patient lying supine in bed,  no acute distress   2. Psychiatric: Alert and oriented x 3, mood and behavior normal for situation, pleasant and cooperative with exam   3. Neurologic: Speech and language are normal, face is symmetric, moves all 4 extremities voluntarily, at baseline without acute deficits on limited exam   4. HEENMT:  Head is atraumatic, normocephalic, pupils reactive to light, neck is supple, trachea is midline, mucous membranes are moist   5. Respiratory : Lungs are clear to auscultation bilaterally without wheezing, rhonchi, rales, no cyanosis, no increase in work of breathing or accessory muscle use   6. Cardiovascular : Heart rate normal, rhythm is regular, no murmurs, rubs or gallops, no peripheral edema, peripheral pulses palpated   7. Gastrointestinal:  Abdomen is soft, nondistended, tender to palpation in the right lower quadrant, rebound tenderness, bowel sounds active, no masses or organomegaly palpated   8. Skin:  Skin is warm, dry and intact without rashes, acute lesions, or ulcers on limited exam   9.Musculoskeletal:  No acute deformities or trauma, no asymmetry in tone, no peripheral edema, peripheral pulses palpated, no tenderness to palpation in the extremities  Data Reviewed: In the ED, patient was slightly tachycardic and tachypneic when she presented as well as hypertensive.   She had a leukocytosis of 18.5, hemoglobin stable CT scan showed acute appendicitis GEN surge was consulted and plans to do appendectomy in the morning There was a common bile duct defect on CT that GEN surge believes to be chronic, patient has no right upper quadrant pain She does have a slightly elevated alk phos at 199 EKG shows sinus tachycardia with a heart rate  of 118, QTc 4 and 65 Blood cultures pending Patient was started on cefepime and Flagyl and given 1 L normal saline Admission requested for acute appendicitis Assessment and Plan: * Acute appendicitis - Textbook appendicitis with periumbilical pain that moved to the right lower quadrant, leukocytosis, rebound tenderness - CT shows acute appendicitis - Cefepime and Flagyl started in the ED - Continue Rocephin and Flagyl - GEN surge consulted and plans to see patient in the a.m. - N.p.o. - Continue analgesics as needed  Chronic pain - Patient reports she has been on 10 mg of oxycodone for a decade - Her pain is can to be harder to control because of this - One-time dose of fentanyl ordered - Morphine every 2 hours - Encouraged her to take oxycodone because it is longer acting in between, but she thinks the milligrams are too low to do her any good -  Patient was asleep when I entered the room so the pain is somewhat controlled  Common bile duct dilation - Abnormality of the common bile duct seen on CT - GEN surge thinks it is more chronic - Plan is to first do appendectomy and then later workup the common bile duct  Tobacco use disorder - Patient reports smoking 5-10 cigarettes a day - Nicotine patch ordered - Counseled on importance of cessation      Advance Care Planning:   Code Status: Full Code  Consults: Neurosurgery  Family Communication: No family at bedside  Severity of Illness: The appropriate patient status for this patient is INPATIENT. Inpatient status is judged to be reasonable and necessary in order to provide the required intensity of service to ensure the patient's safety. The patient's presenting symptoms, physical exam findings, and initial radiographic and laboratory data in the context of their chronic comorbidities is felt to place them at high risk for further clinical deterioration. Furthermore, it is not anticipated that the patient will be medically  stable for discharge from the hospital within 2 midnights of admission.   * I certify that at the point of admission it is my clinical judgment that the patient will require inpatient hospital care spanning beyond 2 midnights from the point of admission due to high intensity of service, high risk for further deterioration and high frequency of surveillance required.*  Author: Lilyan Gilford, DO 04/18/2023 4:36 AM  For on call review [email or web address].

## 2023-04-18 NOTE — Consult Note (Signed)
Reason for Consult: Appendicitis Referring Physician: Dr. Victorino Dike, DO  Angel Tapia is an 57 y.o. female.  HPI: Patient seen and evaluated while lying in bed. Patient is a 57 y.o. female with a past medical history significant for chronic pain and tobacco use disorder who presented to the ED with worsening abdominal pain. She reports about 3-4 days ago experiencing periumbilical pain that localized to the RLQ that night. She describes the pain as sharp and burning and rates current pain as a 7.5-8/10. She endorses coughing and movement makes pain worse. Endorses nausea, 3-4 episodes of vomiting and denies diarrhea or changes in bowel movements or urination.   Of note, patient reports experiencing a sinus infection for the past 2 weeks and taking sudafed and tylenol sinus.   Past Medical History:  Diagnosis Date   Carpal tunnel syndrome    DDD (degenerative disc disease), cervical    DDD (degenerative disc disease), lumbar    Past Surgical History:  Procedure Laterality Date   OOPHORECTOMY     OVARIAN CYST REMOVAL     History reviewed. No pertinent family history.  Social History:  reports that she has been smoking. She has never used smokeless tobacco. She reports that she does not drink alcohol and does not use drugs.  Allergies:  Allergies  Allergen Reactions   Prednisone Palpitations    Moody, Angry   Gabapentin (Once-Daily) Anxiety   Medications: I have reviewed the patient's current medications. Prior to Admission:  Medications Prior to Admission  Medication Sig Dispense Refill Last Dose   albuterol (PROVENTIL HFA;VENTOLIN HFA) 108 (90 Base) MCG/ACT inhaler Inhale 2 puffs into the lungs every 4 (four) hours as needed for wheezing or shortness of breath. 1 Inhaler 0    benzonatate (TESSALON) 100 MG capsule Take 1 capsule (100 mg total) by mouth 3 (three) times daily as needed for cough. 15 capsule 0    clindamycin (CLEOCIN) 300 MG capsule 1 po tid with food  (Patient not taking: Reported on 08/21/2016) 21 capsule 0    dexamethasone (DECADRON) 4 MG tablet Take 1 tablet (4 mg total) by mouth 2 (two) times daily with a meal. (Patient not taking: Reported on 08/21/2016) 12 tablet 0    fluticasone (FLONASE) 50 MCG/ACT nasal spray Place 2 sprays into both nostrils daily.      ibuprofen (ADVIL,MOTRIN) 600 MG tablet Take 1 tablet (600 mg total) by mouth every 6 (six) hours as needed. 30 tablet 0    loratadine-pseudoephedrine (CLARITIN-D 12 HOUR) 5-120 MG tablet Take 1 tablet by mouth 2 (two) times daily. 20 tablet 0    oxyCODONE-acetaminophen (PERCOCET) 10-325 MG tablet Take 1 tablet by mouth every 6 (six) hours as needed for pain.      tiZANidine (ZANAFLEX) 4 MG tablet Take 4 mg by mouth at bedtime as needed and may repeat dose one time if needed for muscle spasms.      Scheduled:  nicotine  7 mg Transdermal Daily   Continuous:  sodium chloride 100 mL/hr at 04/18/23 0747   cefTRIAXone (ROCEPHIN)  IV 2 g (04/18/23 0610)   metronidazole     UJW:[id number] **OR** acetaminophen, HYDROmorphone (DILAUDID) injection, ondansetron **OR** ondansetron (ZOFRAN) IV, oxyCODONE  Results for orders placed or performed during the hospital encounter of 04/17/23 (from the past 48 hour(s))  Blood culture (routine x 2)     Status: None (Preliminary result)   Collection Time: 04/17/23  9:53 PM   Specimen: BLOOD LEFT ARM  Result Value Ref  Range   Specimen Description BLOOD LEFT ARM    Special Requests      Normal BOTTLES DRAWN AEROBIC AND ANAEROBIC Blood Culture adequate volume   Culture      NO GROWTH < 12 HOURS Performed at [facility], [address], [place], [place] [address]    Report Status PENDING   Lipase, blood     Status: None   Collection Time: 04/17/23 10:00 PM  Result Value Ref Range   Lipase 25 11 - 51 U/L    Comment: Performed at [facility], [address], [place], [place] [address]  Comprehensive metabolic panel     Status: Abnormal    Collection Time: 04/17/23 10:00 PM  Result Value Ref Range   Sodium 137 135 - 145 mmol/L   Potassium 4.2 3.5 - 5.1 mmol/L   Chloride 101 98 - 111 mmol/L   CO2 21 (L) 22 - 32 mmol/L   Glucose, Bld 117 (H) 70 - 99 mg/dL    Comment: Glucose reference range applies only to samples taken after fasting for at least 8 hours.   BUN 11 6 - 20 mg/dL   Creatinine, Ser 9.62 0.44 - 1.00 mg/dL   Calcium 9.9 8.9 - 95.2 mg/dL   Total Protein 8.7 (H) 6.5 - 8.1 g/dL   Albumin 4.5 3.5 - 5.0 g/dL   AST 22 15 - 41 U/L   ALT 33 0 - 44 U/L   Alkaline Phosphatase 199 (H) 38 - 126 U/L   Total Bilirubin 0.5 0.3 - 1.2 mg/dL   GFR, Estimated >84 >13 mL/min    Comment: (NOTE) Calculated using the CKD-EPI Creatinine Equation (2021)    Anion gap 15 5 - 15    Comment: Performed at [facility], [address] [place], [place] [address]  CBC     Status: Abnormal   Collection Time: 04/17/23 10:00 PM  Result Value Ref Range   WBC 18.5 (H) 4.0 - 10.5 K/uL   RBC 5.55 (H) 3.87 - 5.11 MIL/uL   Hemoglobin 14.9 12.0 - 15.0 g/dL   HCT 02.7 (H) 25.3 - 66.4 %   MCV 83.1 80.0 - 100.0 fL   MCH 26.8 26.0 - 34.0 pg   MCHC 32.3 30.0 - 36.0 g/dL   RDW 40.3 (H) 47.4 - 25.9 %   Platelets 492 (H) 150 - 400 K/uL   nRBC 0.0 0.0 - 0.2 %    Comment: Performed at [facility], [address] [place], [place] [address]  Lactic acid, plasma     Status: None   Collection Time: 04/17/23 10:00 PM  Result Value Ref Range   Lactic Acid, Venous 1.6 0.5 - 1.9 mmol/L    Comment: Performed at [facility], [address], [place], [place] [address]  Blood culture (routine x 2)     Status: None (Preliminary result)   Collection Time: 04/17/23 10:00 PM   Specimen: Right Antecubital; Blood  Result Value Ref Range   Specimen Description RIGHT ANTECUBITAL    Special Requests      Normal BOTTLES DRAWN AEROBIC AND ANAEROBIC Blood Culture results may not be optimal due to an excessive volume of blood received in culture bottles   Culture       NO GROWTH < 12 HOURS Performed at [facility], [address], [place], [place] [address]    Report Status PENDING   Comprehensive metabolic panel     Status: Abnormal   Collection Time: 04/18/23  4:35 AM  Result  Value Ref Range   Sodium 136 135 - 145 mmol/L   Potassium 4.0 3.5 - 5.1 mmol/L   Chloride 99 98 - 111 mmol/L   CO2 27 22 - 32 mmol/L   Glucose, Bld 115 (H) 70 - 99 mg/dL    Comment: Glucose reference range applies only to samples taken after fasting for at least 8 hours.   BUN 9 6 - 20 mg/dL   Creatinine, Ser 8.29 0.44 - 1.00 mg/dL   Calcium 9.1 8.9 - 56.2 mg/dL   Total Protein 7.6 6.5 - 8.1 g/dL   Albumin 3.8 3.5 - 5.0 g/dL   AST 16 15 - 41 U/L   ALT 27 0 - 44 U/L   Alkaline Phosphatase 167 (H) 38 - 126 U/L   Total Bilirubin 0.5 0.3 - 1.2 mg/dL   GFR, Estimated >13 >08 mL/min    Comment: (NOTE) Calculated using the CKD-EPI Creatinine Equation (2021)    Anion gap 10 5 - 15    Comment: Performed at [facility], [address] [place], [place] [address]  Magnesium     Status: None   Collection Time: 04/18/23  4:35 AM  Result Value Ref Range   Magnesium 1.9 1.7 - 2.4 mg/dL    Comment: Performed at [facility], [address] [place], [place] [address]  CBC with Differential/Platelet     Status: Abnormal   Collection Time: 04/18/23  4:35 AM  Result Value Ref Range   WBC 18.1 (H) 4.0 - 10.5 K/uL   RBC 5.25 (H) 3.87 - 5.11 MIL/uL   Hemoglobin 13.9 12.0 - 15.0 g/dL   HCT 52.8 41.3 - 24.4 %   MCV 84.2 80.0 - 100.0 fL   MCH 26.5 26.0 - 34.0 pg   MCHC 31.4 30.0 - 36.0 g/dL   RDW 01.0 (H) 27.2 - 53.6 %   Platelets 436 (H) 150 - 400 K/uL   nRBC 0.0 0.0 - 0.2 %   Neutrophils Relative % 76 %   Neutro Abs 13.6 (H) 1.7 - 7.7 K/uL   Lymphocytes Relative 12 %   Lymphs Abs 2.1 0.7 - 4.0 K/uL   Monocytes Relative 12 %   Monocytes Absolute 2.1 (H) 0.1 - 1.0 K/uL   Eosinophils Relative 0 %   Eosinophils Absolute 0.1 0.0 - 0.5 K/uL   Basophils Relative 0 %   Basophils  Absolute 0.1 0.0 - 0.1 K/uL   Immature Granulocytes 0 %   Abs Immature Granulocytes 0.08 (H) 0.00 - 0.07 K/uL    Comment: Performed at [facility], [address], [place], [place] [address]  Urinalysis, Routine w reflex microscopic -Urine, Clean Catch     Status: Abnormal   Collection Time: 04/18/23  4:36 AM  Result Value Ref Range   Color, Urine YELLOW YELLOW   APPearance CLEAR CLEAR   Specific Gravity, Urine 1.034 (H) 1.005 - 1.030   pH 6.0 5.0 - 8.0   Glucose, UA NEGATIVE NEGATIVE mg/dL   Hgb urine dipstick SMALL (A) NEGATIVE   Bilirubin Urine NEGATIVE NEGATIVE   Ketones, ur NEGATIVE NEGATIVE mg/dL   Protein, ur NEGATIVE NEGATIVE mg/dL   Nitrite NEGATIVE NEGATIVE   Leukocytes,Ua NEGATIVE NEGATIVE   RBC / HPF 0-5 0 - 5 RBC/hpf   WBC, UA 0-5 0 - 5 WBC/hpf   Bacteria, UA RARE (A) NONE SEEN   Squamous Epithelial / HPF 0-5 0 - 5 /HPF   Mucus PRESENT     Comment: Performed at [facility], [address], [place], [place] [address]   CT ABDOMEN PELVIS W CONTRAST  Result Date: 04/17/2023 CLINICAL DATA:  Right lower quadrant pain for several days EXAM: CT ABDOMEN AND PELVIS WITH CONTRAST TECHNIQUE: Multidetector CT imaging of the abdomen and pelvis was performed using the standard protocol following bolus administration of intravenous contrast. RADIATION DOSE REDUCTION: This exam was performed according to the departmental dose-optimization program which includes automated exposure control, adjustment of the mA and/or kV according to patient size and/or use of iterative reconstruction technique. CONTRAST:  OMNIPAQUE IOHEXOL 300 MG/ML  SOLN COMPARISON:  03/23/2014 FINDINGS: Lower chest: No acute abnormality. Hepatobiliary: Gallbladder is well distended. No cholelithiasis is noted. Liver is within normal limits with the exception of mild biliary ductal dilatation. Filling defect is noted in the distal common bile duct causing the obstructive change. Common bile duct measures approximately  9 mm. Pancreas: Pancreas is within normal limits with the exception of the gallstone in the distal aspect of the common bile duct. Spleen: Normal in size without focal abnormality. Adrenals/Urinary Tract: Adrenal glands are within normal limits. Kidneys demonstrate a normal enhancement pattern bilaterally. No renal calculi or obstructive changes are seen. The bladder is well distended. Stomach/Bowel: Diverticular change of the colon is noted without evidence of diverticulitis. No obstructive changes of the colon are seen. The appendix is well visualized and dilated. Mild hyperemia is noted in the mucosa with Peri appendiceal inflammatory changes consistent with early appendicitis. No findings to suggest perforation are noted. Small bowel and stomach are within normal limits. Vascular/Lymphatic: Aortic atherosclerosis. No enlarged abdominal or pelvic lymph nodes. Reproductive: Uterus and bilateral adnexa are unremarkable. Changes of prior tubal ligation are noted. Other: No abdominal wall hernia or abnormality. No abdominopelvic ascites. Musculoskeletal: No acute or significant osseous findings. IMPRESSION: Changes consistent with acute appendicitis without evidence of perforation. Findings consistent with a filling defect in the distal common bile duct consistent with choledocholithiasis with biliary ductal dilatation. Electronically Signed   By: Alcide Clever M.D.   On: 04/17/2023 23:20    ROS:  Pertinent items are noted in HPI.  Blood pressure 137/74, pulse (!) 116, temperature 99 F (37.2 C), temperature source Oral, resp. rate 19, height 5\' 3"  (1.6 m), weight 88.9 kg, SpO2 94%. Physical Exam Cardiovascular:     Rate and Rhythm: Normal rate.  Pulmonary:     Effort: Pulmonary effort is normal.  Abdominal:     Tenderness: There is abdominal tenderness in the right upper quadrant and right lower quadrant. There is guarding.  Neurological:     Mental Status: She is alert.   Assessment/Plan: Patient  is a 57 y.o. female presenting with RLQ pain concerning for appendicitis.   - CT scan shows findings consistent with early appendicitis. No findings to suggest perforation were noted - Patient scheduled for appendectomy this afternoon - Continue IV antibiotics  - Dilaudid for pain control  Bubba Hales 04/18/2023, 8:18 AM

## 2023-04-18 NOTE — Anesthesia Procedure Notes (Signed)
Procedure Name: Intubation Date/Time: 04/18/2023 1:46 PM  Performed by: Cy Blamer, CRNAPre-anesthesia Checklist: Patient identified, Emergency Drugs available, Suction available and Patient being monitored Patient Re-evaluated:Patient Re-evaluated prior to induction Oxygen Delivery Method: Circle system utilized Preoxygenation: Pre-oxygenation with 100% oxygen Induction Type: IV induction Ventilation: Mask ventilation without difficulty Laryngoscope Size: Miller and 2 Grade View: Grade II Tube type: Oral Tube size: 7.0 mm Number of attempts: 1 Airway Equipment and Method: Stylet and Bite block Placement Confirmation: ETT inserted through vocal cords under direct vision, positive ETCO2 and breath sounds checked- equal and bilateral Secured at: 21 cm Tube secured with: Tape Dental Injury: Teeth and Oropharynx as per pre-operative assessment

## 2023-04-18 NOTE — Anesthesia Postprocedure Evaluation (Signed)
Anesthesia Post Note  Patient: Angel Tapia  Procedure(s) Performed: XI ROBOTIC LAPAROSCOPIC ASSISTED APPENDECTOMY (Abdomen)  Patient location during evaluation: PACU Anesthesia Type: General Level of consciousness: awake and alert Pain management: pain level controlled Vital Signs Assessment: post-procedure vital signs reviewed and stable Respiratory status: spontaneous breathing, nonlabored ventilation, respiratory function stable and patient connected to nasal cannula oxygen Cardiovascular status: blood pressure returned to baseline and stable Postop Assessment: no apparent nausea or vomiting Anesthetic complications: no   There were no known notable events for this encounter.   Last Vitals:  Vitals:   04/18/23 1515 04/18/23 1524  BP: 113/76   Pulse: (!) 110 (!) 106  Resp: 20 17  Temp:  36.9 C  SpO2: 99% 100%    Last Pain:  Vitals:   04/18/23 1515  TempSrc:   PainSc: Asleep                 Earle Troiano L Juniper Cobey

## 2023-04-18 NOTE — ED Notes (Signed)
ED TO INPATIENT HANDOFF REPORT  ED Nurse Name and Phone #:   S Name/Age/Gender Angel Tapia 57 y.o. female Room/Bed: APA09/APA09  Code Status   Code Status: Full Code  Home/SNF/Other Home Patient oriented to: self, place, time, and situation Is this baseline? Yes   Triage Complete: Triage complete  Chief Complaint Acute appendicitis [K35.80]  Triage Note RLQ ABD pain x3 days Complains of n/v/d Pain 8/10 Increased pain on palpation per pt    Allergies Allergies  Allergen Reactions   Prednisone Palpitations    Moody, Angry   Gabapentin (Once-Daily) Anxiety    Level of Care/Admitting Diagnosis ED Disposition     ED Disposition  Admit   Condition  --   Comment  Hospital Area: [facility] [[id number]]  Level of Care: Med-Surg [16]  Covid Evaluation: Asymptomatic - no recent exposure (last 10 days) testing not required  Diagnosis: Acute appendicitis [034742]  Admitting Physician: Lilyan Gilford [[id number]]  Attending Physician: Lilyan Gilford [[id number]]  Certification:: I certify this patient will need inpatient services for at least 2 midnights  Expected Medical Readiness: 04/21/2023          B Medical/Surgery History Past Medical History:  Diagnosis Date   Carpal tunnel syndrome    DDD (degenerative disc disease), cervical    DDD (degenerative disc disease), lumbar    Past Surgical History:  Procedure Laterality Date   OOPHORECTOMY     OVARIAN CYST REMOVAL       A IV Location/Drains/Wounds Patient Lines/Drains/Airways Status     Active Line/Drains/Airways     Name Placement date Placement time Site Days   Peripheral IV 04/17/23 20 G 1" Anterior;Proximal;Right Forearm 04/17/23  2200  Forearm  1            Intake/Output Last 24 hours  Intake/Output Summary (Last 24 hours) at 04/18/2023 0033 Last data filed at 04/17/2023 2348 Gross per 24 hour  Intake 500 ml  Output --  Net 500 ml    Labs/Imaging Results for  orders placed or performed during the hospital encounter of 04/17/23 (from the past 48 hour(s))  Blood culture (routine x 2)     Status: None (Preliminary result)   Collection Time: 04/17/23  9:53 PM   Specimen: BLOOD LEFT ARM  Result Value Ref Range   Specimen Description BLOOD LEFT ARM    Special Requests      Normal BOTTLES DRAWN AEROBIC AND ANAEROBIC Blood Culture adequate volume Performed at [facility], [address] [place], [place] [address]    Culture PENDING    Report Status PENDING   Lipase, blood     Status: None   Collection Time: 04/17/23 10:00 PM  Result Value Ref Range   Lipase 25 11 - 51 U/L    Comment: Performed at [facility], [address] [place], [place] [address]  Comprehensive metabolic panel     Status: Abnormal   Collection Time: 04/17/23 10:00 PM  Result Value Ref Range   Sodium 137 135 - 145 mmol/L   Potassium 4.2 3.5 - 5.1 mmol/L   Chloride 101 98 - 111 mmol/L   CO2 21 (L) 22 - 32 mmol/L   Glucose, Bld 117 (H) 70 - 99 mg/dL    Comment: Glucose reference range applies only to samples taken after fasting for at least 8 hours.   BUN 11 6 - 20 mg/dL   Creatinine, Ser 3.01 0.44 - 1.00 mg/dL   Calcium 9.9 8.9 - 60.1 mg/dL  Total Protein 8.7 (H) 6.5 - 8.1 g/dL   Albumin 4.5 3.5 - 5.0 g/dL   AST 22 15 - 41 U/L   ALT 33 0 - 44 U/L   Alkaline Phosphatase 199 (H) 38 - 126 U/L   Total Bilirubin 0.5 0.3 - 1.2 mg/dL   GFR, Estimated >16 >10 mL/min    Comment: (NOTE) Calculated using the CKD-EPI Creatinine Equation (2021)    Anion gap 15 5 - 15    Comment: Performed at [facility], [address], [place], [place] [address]  CBC     Status: Abnormal   Collection Time: 04/17/23 10:00 PM  Result Value Ref Range   WBC 18.5 (H) 4.0 - 10.5 K/uL   RBC 5.55 (H) 3.87 - 5.11 MIL/uL   Hemoglobin 14.9 12.0 - 15.0 g/dL   HCT 40.9 (H) 81.1 - 91.4 %   MCV 83.1 80.0 - 100.0 fL   MCH 26.8 26.0 - 34.0 pg   MCHC 32.3 30.0 - 36.0 g/dL   RDW 78.2 (H) 95.6 - 21.3 %    Platelets 492 (H) 150 - 400 K/uL   nRBC 0.0 0.0 - 0.2 %    Comment: Performed at [facility] [address], [place], [place] [address]  Lactic acid, plasma     Status: None   Collection Time: 04/17/23 10:00 PM  Result Value Ref Range   Lactic Acid, Venous 1.6 0.5 - 1.9 mmol/L    Comment: Performed at [facility], [address], [place], [place] [address]  Blood culture (routine x 2)     Status: None (Preliminary result)   Collection Time: 04/17/23 10:00 PM   Specimen: Right Antecubital; Blood  Result Value Ref Range   Specimen Description RIGHT ANTECUBITAL    Special Requests      Normal BOTTLES DRAWN AEROBIC AND ANAEROBIC Blood Culture results may not be optimal due to an excessive volume of blood received in culture bottles Performed at [facility], [address], [place], [place] [address]    Culture PENDING    Report Status PENDING    CT ABDOMEN PELVIS W CONTRAST  Result Date: 04/17/2023 CLINICAL DATA:  Right lower quadrant pain for several days EXAM: CT ABDOMEN AND PELVIS WITH CONTRAST TECHNIQUE: Multidetector CT imaging of the abdomen and pelvis was performed using the standard protocol following bolus administration of intravenous contrast. RADIATION DOSE REDUCTION: This exam was performed according to the departmental dose-optimization program which includes automated exposure control, adjustment of the mA and/or kV according to patient size and/or use of iterative reconstruction technique. CONTRAST:  OMNIPAQUE IOHEXOL 300 MG/ML  SOLN COMPARISON:  03/23/2014 FINDINGS: Lower chest: No acute abnormality. Hepatobiliary: Gallbladder is well distended. No cholelithiasis is noted. Liver is within normal limits with the exception of mild biliary ductal dilatation. Filling defect is noted in the distal common bile duct causing the obstructive change. Common bile duct measures approximately 9 mm. Pancreas: Pancreas is within normal limits with the exception of the gallstone in the  distal aspect of the common bile duct. Spleen: Normal in size without focal abnormality. Adrenals/Urinary Tract: Adrenal glands are within normal limits. Kidneys demonstrate a normal enhancement pattern bilaterally. No renal calculi or obstructive changes are seen. The bladder is well distended. Stomach/Bowel: Diverticular change of the colon is noted without evidence of diverticulitis. No obstructive changes of the colon are seen. The appendix is well visualized and dilated. Mild hyperemia is noted in the mucosa with Peri appendiceal inflammatory changes consistent with early  appendicitis. No findings to suggest perforation are noted. Small bowel and stomach are within normal limits. Vascular/Lymphatic: Aortic atherosclerosis. No enlarged abdominal or pelvic lymph nodes. Reproductive: Uterus and bilateral adnexa are unremarkable. Changes of prior tubal ligation are noted. Other: No abdominal wall hernia or abnormality. No abdominopelvic ascites. Musculoskeletal: No acute or significant osseous findings. IMPRESSION: Changes consistent with acute appendicitis without evidence of perforation. Findings consistent with a filling defect in the distal common bile duct consistent with choledocholithiasis with biliary ductal dilatation. Electronically Signed   By: Alcide Clever M.D.   On: 04/17/2023 23:20    Pending Labs Unresulted Labs (From admission, onward)     Start     Ordered   04/17/23 2136  Urinalysis, Routine w reflex microscopic -Urine, Clean Catch  Once,   URGENT       Question:  Specimen Source  Answer:  Urine, Clean Catch   04/17/23 2136   Signed and Held  HIV Antibody (routine testing w rflx)  (HIV Antibody (Routine testing w reflex) panel)  Once,   R        Signed and Held   Signed and Held  Comprehensive metabolic panel  Tomorrow morning,   R        Signed and Held   Signed and Held  Magnesium  Tomorrow morning,   R        Signed and Held   Signed and Held  CBC with Differential/Platelet   Tomorrow morning,   R        Signed and Held            Vitals/Pain Today's Vitals   04/17/23 2134 04/17/23 2241 04/17/23 2345 04/18/23 0000  BP: (!) 174/100 138/83 (!) 156/88   Pulse: (!) 127 (!) 110 (!) 106   Resp: (!) 26 (!) 22 17   Temp: 98.9 F (37.2 C)     TempSrc: Oral     SpO2: 100% 94% 98%   Weight:      Height:      PainSc:  5  8  6      Isolation Precautions No active isolations  Medications Medications  ceFEPIme (MAXIPIME) 2 g in sodium chloride 0.9 % 100 mL IVPB (0 g Intravenous Stopped 04/17/23 2343)    And  metroNIDAZOLE (FLAGYL) IVPB 500 mg (500 mg Intravenous New Bag/Given 04/17/23 2348)  ondansetron (ZOFRAN) injection 4 mg (4 mg Intravenous Given 04/17/23 2224)  morphine (PF) 4 MG/ML injection 4 mg (4 mg Intravenous Given 04/17/23 2228)  sodium chloride 0.9 % bolus 1,000 mL (0 mLs Intravenous Stopped 04/17/23 2348)  iohexol (OMNIPAQUE) 300 MG/ML solution 100 mL (100 mLs Intravenous Contrast Given 04/17/23 2303)  HYDROmorphone (DILAUDID) injection 1 mg (1 mg Intravenous Given 04/17/23 2346)    Mobility walks     Focused Assessments    R Recommendations: See Admitting Provider Note  Report given to:   Additional Notes:

## 2023-04-18 NOTE — Transfer of Care (Signed)
Immediate Anesthesia Transfer of Care Note  Patient: Angel Tapia  Procedure(s) Performed: XI ROBOTIC LAPAROSCOPIC ASSISTED APPENDECTOMY (Abdomen)  Patient Location: PACU  Anesthesia Type:General  Level of Consciousness: awake, alert , and oriented  Airway & Oxygen Therapy: Patient Spontanous Breathing and Patient connected to nasal cannula oxygen  Post-op Assessment: Report given to RN and Post -op Vital signs reviewed and stable  Post vital signs: Reviewed and stable  Last Vitals:  Vitals Value Taken Time  BP 130/76 04/18/23 1450  Temp    Pulse 115 04/18/23 1451  Resp 18 04/18/23 1451  SpO2 100 % 04/18/23 1451  Vitals shown include unfiled device data.  Last Pain:  Vitals:   04/18/23 1208  TempSrc: Oral  PainSc: 7       Patients Stated Pain Goal: 4 (04/18/23 1208)  Complications: No notable events documented.

## 2023-04-18 NOTE — Anesthesia Preprocedure Evaluation (Addendum)
Anesthesia Evaluation  Patient identified by MRN, date of birth, ID band Patient awake    Reviewed: Allergy & Precautions, H&P , NPO status , Patient's Chart, lab work & pertinent test results, reviewed documented beta blocker date and time   Airway Mallampati: II  TM Distance: >3 FB Neck ROM: full    Dental  (+) Dental Advisory Given, Missing, Partial Upper,    Pulmonary Current Smoker and Patient abstained from smoking.   Pulmonary exam normal breath sounds clear to auscultation       Cardiovascular Exercise Tolerance: Good negative cardio ROS Normal cardiovascular exam Rhythm:regular Rate:Normal     Neuro/Psych negative neurological ROS  negative psych ROS   GI/Hepatic negative GI ROS, Neg liver ROS,,,  Endo/Other  negative endocrine ROS    Renal/GU negative Renal ROS  negative genitourinary   Musculoskeletal   Abdominal   Peds  Hematology negative hematology ROS (+)   Anesthesia Other Findings   Reproductive/Obstetrics negative OB ROS                             Anesthesia Physical Anesthesia Plan  ASA: 1 and emergent  Anesthesia Plan: General   Post-op Pain Management:    Induction:   PONV Risk Score and Plan: Ondansetron, Dexamethasone, Midazolam and Scopolamine patch - Pre-op  Airway Management Planned: Oral ETT  Additional Equipment: None  Intra-op Plan:   Post-operative Plan: Extubation in OR  Informed Consent: I have reviewed the patients History and Physical, chart, labs and discussed the procedure including the risks, benefits and alternatives for the proposed anesthesia with the patient or authorized representative who has indicated his/her understanding and acceptance.     Dental Advisory Given  Plan Discussed with: CRNA  Anesthesia Plan Comments:         Anesthesia Quick Evaluation

## 2023-04-18 NOTE — Plan of Care (Signed)
  Problem: Education: Goal: Knowledge of General Education information will improve Description Including pain rating scale, medication(s)/side effects and non-pharmacologic comfort measures Outcome: Progressing   Problem: Health Behavior/Discharge Planning: Goal: Ability to manage health-related needs will improve Outcome: Progressing   

## 2023-04-18 NOTE — Hospital Course (Addendum)
57yo with h/o chronic pain and tobacco dependence who presented on 10/24 with abdominal pain.  Imaging showed acute appendicitis.  She was started on Cefepime -> Ceftriaxone and Flagyl and general surgery will consult.

## 2023-04-19 ENCOUNTER — Other Ambulatory Visit (INDEPENDENT_AMBULATORY_CARE_PROVIDER_SITE_OTHER): Payer: Self-pay | Admitting: General Surgery

## 2023-04-19 DIAGNOSIS — Z09 Encounter for follow-up examination after completed treatment for conditions other than malignant neoplasm: Secondary | ICD-10-CM

## 2023-04-19 DIAGNOSIS — K3580 Unspecified acute appendicitis: Secondary | ICD-10-CM | POA: Diagnosis not present

## 2023-04-19 DIAGNOSIS — K352 Acute appendicitis with generalized peritonitis, without perforation or abscess: Secondary | ICD-10-CM | POA: Diagnosis not present

## 2023-04-19 LAB — BASIC METABOLIC PANEL
Anion gap: 7 (ref 5–15)
BUN: 14 mg/dL (ref 6–20)
CO2: 26 mmol/L (ref 22–32)
Calcium: 8.7 mg/dL — ABNORMAL LOW (ref 8.9–10.3)
Chloride: 101 mmol/L (ref 98–111)
Creatinine, Ser: 0.63 mg/dL (ref 0.44–1.00)
GFR, Estimated: >60 mL/min (ref >60)
Glucose, Bld: 146 mg/dL — ABNORMAL HIGH (ref 70–99)
Potassium: 4.2 mmol/L (ref 3.5–5.1)
Sodium: 134 mmol/L — ABNORMAL LOW (ref 135–145)

## 2023-04-19 LAB — CBC
HCT: 34.8 % — ABNORMAL LOW (ref 36.0–46.0)
Hemoglobin: 11.2 g/dL — ABNORMAL LOW (ref 12.0–15.0)
MCH: 27.1 pg (ref 26.0–34.0)
MCHC: 32.2 g/dL (ref 30.0–36.0)
MCV: 84.3 fL (ref 80.0–100.0)
Platelets: 295 10*3/uL (ref 150–400)
RBC: 4.13 MIL/uL (ref 3.87–5.11)
RDW: 16.2 % — ABNORMAL HIGH (ref 11.5–15.5)
WBC: 15.1 10*3/uL — ABNORMAL HIGH (ref 4.0–10.5)
nRBC: 0 % (ref 0.0–0.2)

## 2023-04-19 MED ORDER — OXYCODONE-ACETAMINOPHEN 10-325 MG PO TABS: 1.0000 | ORAL_TABLET | Freq: Four times a day (QID) | ORAL | 0 refills | Status: DC | PRN

## 2023-04-19 MED ORDER — OXYCODONE-ACETAMINOPHEN 10-325 MG PO TABS: 1.0000 | ORAL_TABLET | ORAL | 0 refills | Status: AC | PRN

## 2023-04-19 NOTE — Care Management CC44 (Signed)
Condition Code 44 Documentation Completed  Patient Details  Name: TIFFONY CLARIZIO MRN: [id number] Date of Birth: 12/17/1965   Condition Code 44 given:   yes Patient signature on Condition Code 44 notice:   yes Documentation of 2 MD's agreement:   yes Code 44 added to claim:   yes    Catalina Gravel, LCSW 04/19/2023, 9:44 AM

## 2023-04-19 NOTE — Progress Notes (Signed)
Change frequency of percocet

## 2023-04-19 NOTE — TOC Progression Note (Signed)
Transition of Care [facility]) - Progression Note    Patient Details  Name: Angel Tapia MRN: [id number] Date of Birth: 26-Apr-1966  Transition of Care [facility]) CM/SW Contact  Catalina Gravel, LCSW Phone Number: 04/19/2023, 9:59 AM  Clinical Narrative:    CSW met with pt at bedside to deliver C44 and OBS, Pt read document understood and signed.  CSW then printed a signed copy and handed to pt.  Pt clear for discharge. No further TOC needs.      Barriers to Discharge: No Barriers Identified  Expected Discharge Plan and Services         Expected Discharge Date: 04/19/23                                     Social Determinants of Health (SDOH) Interventions SDOH Screenings   Food Insecurity: No Food Insecurity (04/18/2023)  Housing: Patient Unable To Answer (04/18/2023)  Transportation Needs: No Transportation Needs (04/18/2023)  Utilities: Not At Risk (04/18/2023)  Social Connections: Unknown (11/02/2021)   Received from [facility]  Stress: No Stress Concern Present (02/09/2020)   Received from [facility]  Tobacco Use: High Risk (04/18/2023)    Readmission Risk Interventions     No data to display

## 2023-04-19 NOTE — Consult Note (Signed)
Consult Note   Patient: Angel Tapia DOB: [birth year] DOA: 04/17/2023     1 DOS: the patient was seen and examined on 04/19/2023   Brief hospital course: 57yo with h/o chronic pain and tobacco dependence who presented on 10/24 with abdominal pain.  Imaging showed acute appendicitis.  She was started on Cefepime -> Ceftriaxone and Flagyl and general surgery will consult.  Assessment and Plan:  Acute appendicitis -Textbook appendicitis with periumbilical pain that moved to the right lower quadrant, leukocytosis, rebound tenderness -CT showed acute appendicitis -Cefepime and Flagyl started in the ED -> Ceftriaxone and metronidazole -Surgery consulted, went to OR on 10/25 -Surgery has seen this AM and discharged   Chronic pain -Patient reports she has been on 10 mg of oxycodone for a decade -Her pain can to be harder to control because of this -Resume home pain meds upon discharge or as per surgery   Common bile duct dilation -Abnormality of the common bile duct seen on CT -GEN surge thinks it is more chronic -Plan is to first do appendectomy and then later workup the common bile duct -She reports a plan to see Dr. Lovell Sheehan in the office in a couple of weeks to discuss   HLD -Continue atorvastatin   Tobacco use disorder -Patient reports smoking 5-10 cigarettes a day -Nicotine patch ordered -Counseled on importance of cessation   Obesity -Body mass index is 34.72 kg/m..  -Weight loss should be encouraged -Outpatient PCP/bariatric medicine/bariatric surgery f/u encouraged        Consultants: Surgery   Procedures: Appendectomy 10/25   Antibiotics: Cefepime x 1 Metronidazole 10/24-26 Ceftriaxone 10/25-26    30 Day Unplanned Readmission Risk Score    Flowsheet Row ED to Hosp-Admission (Current) from 04/17/2023 in [facility] SURGICAL UNIT  30 Day Unplanned Readmission Risk Score (%) 6.26 Filed at 04/19/2023 0400       This score is the  patient's risk of an unplanned readmission within 30 days of being discharged (0 -100%). The score is based on dignosis, age, lab data, medications, orders, and past utilization.   Low:  0-14.9   Medium: 15-21.9   High: 22-29.9   Extreme: 30 and above           Subjective: Reports feeling much better.  Dr. Lovell Sheehan has seen and discharged her and she feels ready to go home.   Objective: Vitals:   04/18/23 2249 04/19/23 0500  BP: 112/76 114/74  Pulse: (!) 110 (!) 104  Resp: 20 20  Temp: 98 F (36.7 C) 99.1 F (37.3 C)  SpO2: 95% 97%    Intake/Output Summary (Last 24 hours) at 04/19/2023 0723 Last data filed at 04/18/2023 1800 Gross per 24 hour  Intake 1149.37 ml  Output 5 ml  Net 1144.37 ml   Filed Weights   04/17/23 2133  Weight: 88.9 kg    Exam:  General:  Appears much more comfortable, sitting up at the bedside in a chair eating breakfast without difficulty Eyes:  EOMI, normal lids, iris ENT:  grossly normal hearing, lips & tongue, mmm Neck:  no LAD, masses or thyromegaly Cardiovascular:  RRR, no m/r/g. No LE edema.  Respiratory:   CTA bilaterally with no wheezes/rales/rhonchi.  Normal respiratory effort. Abdomen:  markedly improved, scant surgical scars, appropriately but only mildly tender Skin:  no rash or induration seen on limited exam Musculoskeletal:  grossly normal tone BUE/BLE, good ROM, no bony abnormality Psychiatric:  pleasant mood and affect, speech fluent and appropriate, AOx3  Neurologic:  CN 2-12 grossly intact, moves all extremities in coordinated fashion   Data Reviewed: I have reviewed the patient's lab results since admission.  Pertinent labs for today include:   Glucose 146 WBC 15.1 Hgb 11.2     Family Communication: None present  Disposition: Home today  Time spent: 40 minutes  Unresulted Labs (From admission, onward)    None        Author: Jonah Blue, MD 04/19/2023 7:23 AM  For on call review [email or web address].

## 2023-04-19 NOTE — Progress Notes (Signed)
Patient discharged home with instructions given on medications and follow up visits,patient verbalized understanding. IV discontinued ,catheter intact. Medications sent to Pharmacy of choice documented on AVS. Staff to accompany patient to awaiting vehicle.

## 2023-04-19 NOTE — Care Management Obs Status (Signed)
MEDICARE OBSERVATION STATUS NOTIFICATION   Patient Details  Name: ANALYAH STUTTS MRN: [id number] Date of Birth: 11-09-1965   Medicare Observation Status Notification Given:  Yes    Dyami Umbach Marsh Dolly, LCSW 04/19/2023, 9:42 AM

## 2023-04-19 NOTE — Discharge Summary (Signed)
Physician Discharge Summary  Patient ID: Angel Tapia MRN: [id number] DOB/AGE: March 17, 1966 57 y.o.  Admit date: 04/17/2023 Discharge date: 04/19/2023  Admission Diagnoses: Acute appendicitis  Discharge Diagnoses:  Principal Problem:   Acute appendicitis Active Problems:   Tobacco use disorder   Common bile duct dilation   Chronic pain   Dyslipidemia   Class 1 obesity due to excess calories with body mass index (BMI) of 34.0 to 34.9 in adult   Gangrenous appendicitis   Discharged Condition: good  Hospital Course: Patient is a 57 year old white female who presented to the emergency room on 04/17/2023 with right lower quadrant abdominal pain.  CT scan of the abdomen revealed acute appendicitis.  The patient was taken to the operating room on 04/18/2023 and underwent a robotic assisted laparoscopic appendectomy.  A gangrenous appendix was found.  She tolerated the surgery well.  Her postoperative course was unremarkable.  Her diet was advanced without difficulty.  Her leukocytosis is normalizing.  Incidentally, she was found on CT scan of the abdomen and pelvis to have a possible filling defect in the common bile duct.  This will be addressed as an outpatient.  The patient is being discharged home on 04/19/2023 in good and improving condition.  Treatments: surgery: Robotic assisted laparoscopic appendectomy on 04/18/2023  Discharge Exam: Blood pressure 102/81, pulse 96, temperature 98.2 F (36.8 C), temperature source Oral, resp. rate 19, height 5\' 3"  (1.6 m), weight 88.9 kg, SpO2 97%. General appearance: alert, cooperative, and no distress Resp: clear to auscultation bilaterally Cardio: regular rate and rhythm, S1, S2 normal, no murmur, click, rub or gallop GI: Soft, incisions healing well.  Disposition: Discharge disposition: 01-Home or Self Care       Discharge Instructions     Diet - low sodium heart healthy   Complete by: As directed    Increase activity slowly    Complete by: As directed       Allergies as of 04/19/2023       Reactions   Prednisone Palpitations   Moody, Angry   Gabapentin (once-daily) Anxiety        Medication List     TAKE these medications    albuterol 108 (90 Base) MCG/ACT inhaler Commonly known as: VENTOLIN HFA Inhale 2 puffs into the lungs every 4 (four) hours as needed for wheezing or shortness of breath.   aspirin EC 81 MG tablet Take 81 mg by mouth daily. Swallow whole.   atorvastatin 40 MG tablet Commonly known as: LIPITOR Take 40 mg by mouth at bedtime.   benzonatate 100 MG capsule Commonly known as: TESSALON Take 1 capsule (100 mg total) by mouth 3 (three) times daily as needed for cough.   fluticasone 50 MCG/ACT nasal spray Commonly known as: FLONASE Place 1 spray into both nostrils daily as needed for allergies.   ibuprofen 600 MG tablet Commonly known as: ADVIL Take 1 tablet (600 mg total) by mouth every 6 (six) hours as needed.   loratadine-pseudoephedrine 5-120 MG tablet Commonly known as: Claritin-D 12 Hour Take 1 tablet by mouth 2 (two) times daily. What changed:  when to take this reasons to take this   oxyCODONE-acetaminophen 10-325 MG tablet Commonly known as: Percocet Take 1 tablet by mouth every 6 (six) hours as needed for pain. What changed:  how much to take when to take this reasons to take this additional instructions   tiZANidine 4 MG tablet Commonly known as: ZANAFLEX Take 4 mg by mouth at bedtime as needed and  may repeat dose one time if needed for muscle spasms.        Follow-up Information     Franky Macho, MD. Schedule an appointment as soon as possible for a visit on 04/29/2023.   Specialty: General Surgery Contact information: [address] [place] [address] [phone]                 Signed: Franky Macho 04/19/2023, 8:48 AM

## 2023-04-22 LAB — CULTURE, BLOOD (ROUTINE X 2)
Culture: NO GROWTH
Culture: NO GROWTH

## 2023-04-22 LAB — SURGICAL PATHOLOGY

## 2023-04-23 ENCOUNTER — Encounter (HOSPITAL_COMMUNITY): Payer: Self-pay | Admitting: General Surgery

## 2023-05-06 ENCOUNTER — Other Ambulatory Visit (HOSPITAL_COMMUNITY): Payer: Self-pay | Admitting: Nurse Practitioner

## 2023-05-06 DIAGNOSIS — Z1231 Encounter for screening mammogram for malignant neoplasm of breast: Secondary | ICD-10-CM

## 2023-05-15 ENCOUNTER — Ambulatory Visit (HOSPITAL_COMMUNITY): Payer: 59

## 2023-05-19 ENCOUNTER — Ambulatory Visit (HOSPITAL_COMMUNITY)
Admission: RE | Admit: 2023-05-19 | Discharge: 2023-05-19 | Disposition: A | Discharge status: Home / Self Care | Payer: 59 | Source: Ambulatory Visit | Attending: Nurse Practitioner | Admitting: Nurse Practitioner

## 2023-05-19 DIAGNOSIS — Z1231 Encounter for screening mammogram for malignant neoplasm of breast: Secondary | ICD-10-CM | POA: Diagnosis present

## 2024-04-22 ENCOUNTER — Encounter: Payer: Self-pay | Admitting: Oncology

## 2024-04-22 ENCOUNTER — Inpatient Hospital Stay

## 2024-04-22 ENCOUNTER — Inpatient Hospital Stay: Attending: Oncology | Admitting: Oncology

## 2024-04-22 VITALS — BP 103/73 | HR 96 | Temp 98.2°F | Resp 17 | Ht 63.0 in | Wt 186.9 lb

## 2024-04-22 DIAGNOSIS — D75839 Thrombocytosis, unspecified: Secondary | ICD-10-CM | POA: Diagnosis present

## 2024-04-22 DIAGNOSIS — K35891 Other acute appendicitis without perforation, with gangrene: Secondary | ICD-10-CM | POA: Insufficient documentation

## 2024-04-22 DIAGNOSIS — D649 Anemia, unspecified: Secondary | ICD-10-CM | POA: Diagnosis not present

## 2024-04-22 DIAGNOSIS — F17210 Nicotine dependence, cigarettes, uncomplicated: Secondary | ICD-10-CM | POA: Diagnosis not present

## 2024-04-22 LAB — CBC WITH DIFFERENTIAL (CANCER CENTER ONLY)
Abs Immature Granulocytes: 0.01 K/uL (ref 0.00–0.07)
Basophils Absolute: 0.1 K/uL (ref 0.0–0.1)
Basophils Relative: 1 %
Eosinophils Absolute: 0.5 K/uL (ref 0.0–0.5)
Eosinophils Relative: 8 %
HCT: 38.8 % (ref 36.0–46.0)
Hemoglobin: 12.2 g/dL (ref 12.0–15.0)
Immature Granulocytes: 0 %
Lymphocytes Relative: 34 %
Lymphs Abs: 2.2 K/uL (ref 0.7–4.0)
MCH: 24.9 pg — ABNORMAL LOW (ref 26.0–34.0)
MCHC: 31.4 g/dL (ref 30.0–36.0)
MCV: 79.3 fL — ABNORMAL LOW (ref 80.0–100.0)
Monocytes Absolute: 0.7 K/uL (ref 0.1–1.0)
Monocytes Relative: 11 %
Neutro Abs: 3 K/uL (ref 1.7–7.7)
Neutrophils Relative %: 46 %
Platelet Count: 418 K/uL — ABNORMAL HIGH (ref 150–400)
RBC: 4.89 MIL/uL (ref 3.87–5.11)
RDW: 17.8 % — ABNORMAL HIGH (ref 11.5–15.5)
WBC Count: 6.5 K/uL (ref 4.0–10.5)
nRBC: 0 % (ref 0.0–0.2)

## 2024-04-22 LAB — CMP (CANCER CENTER ONLY)
ALT: 19 U/L (ref 0–44)
AST: 24 U/L (ref 15–41)
Albumin: 4.2 g/dL (ref 3.5–5.0)
Alkaline Phosphatase: 147 U/L — ABNORMAL HIGH (ref 38–126)
Anion gap: 7 (ref 5–15)
BUN: 10 mg/dL (ref 6–20)
CO2: 33 mmol/L — ABNORMAL HIGH (ref 22–32)
Calcium: 9.6 mg/dL (ref 8.9–10.3)
Chloride: 98 mmol/L (ref 98–111)
Creatinine: 0.86 mg/dL (ref 0.44–1.00)
GFR, Estimated: >60 mL/min (ref >60)
Glucose, Bld: 110 mg/dL — ABNORMAL HIGH (ref 70–99)
Potassium: 4.7 mmol/L (ref 3.5–5.1)
Sodium: 138 mmol/L (ref 135–145)
Total Bilirubin: 0.3 mg/dL (ref 0.0–1.2)
Total Protein: 8.2 g/dL — ABNORMAL HIGH (ref 6.5–8.1)

## 2024-04-22 LAB — C-REACTIVE PROTEIN: CRP: 0.8 mg/dL (ref <1.0)

## 2024-04-22 LAB — IRON AND IRON BINDING CAPACITY (CC-WL,HP ONLY)
Iron: 30 ug/dL (ref 28–170)
Saturation Ratios: 6 % — ABNORMAL LOW (ref 10.4–31.8)
TIBC: 508 ug/dL — ABNORMAL HIGH (ref 250–450)
UIBC: 478 ug/dL — ABNORMAL HIGH (ref 148–442)

## 2024-04-22 LAB — FERRITIN: Ferritin: 16 ng/mL (ref 11–307)

## 2024-04-22 LAB — SEDIMENTATION RATE: Sed Rate: 40 mm/h — ABNORMAL HIGH (ref 0–22)

## 2024-04-22 LAB — LACTATE DEHYDROGENASE: LDH: 217 U/L — ABNORMAL HIGH (ref 98–192)

## 2024-04-22 NOTE — Progress Notes (Signed)
 [facility]  HEMATOLOGY CLINIC CONSULTATION NOTE  PATIENT NAME: Angel Tapia   MR#: [id number] DOB: April 19, 1966  DATE OF SERVICE: 04/22/2024  Patient Care Team: Reggie Nian, Thora, NP as PCP - General (Nurse Practitioner) Harvey Margo CROME, MD (Inactive) as Consulting Physician (Gastroenterology)  REASON FOR CONSULTATION/ CHIEF COMPLAINT:  Thrombocytosis  ASSESSMENT & PLAN:  Angel Tapia is a 58 y.o. 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  Likely reactionary from cholecystitis around the time of thrombocytosis.  Thrombocytosis Chronic thrombocytosis with fluctuating platelet counts, peaking at 607,000 in September 2025. Normal white and red blood cell counts.   Recent gangrenous appendicitis and gallbladder issues, including a bile duct stone, may have contributed to elevated platelet counts due to stress and inflammation. Differential diagnosis includes reactive thrombocytosis due to low iron levels or inflammation, and potential bone marrow disorder, though less likely given normal white and red blood cell counts.   No symptoms of bone marrow disorder such as fatigue, blood clots, or splenomegaly. Anemia since childhood, with iron supplementation during pregnancies.   Current high aspirin dosage may risk kidney function impairment, heartburn, and ulcers.  Labs today showed improved platelet count of 418,000.  White count 6500 with normal differential.  Hemoglobin normal at 12.2.  Will pursue workup for intraparenchymal cirrhosis including iron studies, ESR, CRP, JAK2 mutation analysis with reflex testing to rule out primary myeloproliferative neoplasm.  - Advise switching from  325 mg aspirin to 81 mg enteric-coated aspirin for clot prevention.  - Advise staying hydrated   - Call her in two weeks to discuss results of current workup.  - Schedule follow-up appointment in three months to repeat labs and assess platelet count.   I reviewed lab results and outside records for this visit and discussed relevant results with the patient. Diagnosis, plan of care and treatment options were also discussed in detail with the patient. Opportunity provided to ask questions and answers provided to her apparent satisfaction. Provided instructions to call our clinic with any problems, questions or concerns prior to return visit. I recommended to continue follow-up with PCP and sub-specialists. She verbalized understanding and agreed with the plan. No barriers to learning was detected.  Chinita Patten, MD  04/22/2024 11:09 AM  [facility] - A DEPT OF [facility] [address] [place] [address] Dept: [phone] Dept Fax: [phone]   I spent a total of 55 minutes during this encounter with the patient including review of chart and various tests results, discussions about plan of care and coordination of care plan.  HISTORY OF PRESENTING ILLNESS:   Discussed the use of AI scribe software for clinical note transcription with the patient, who gave verbal consent to proceed.  History of Present Illness Angel Tapia is a 58 year old female who presents with elevated platelet counts for hematological evaluation. She was referred by her primary care physician, Gordy Sparks, for evaluation of elevated platelet counts.  On 03/06/2024, labs at her PCPs office showed platelet count of 607,000.  White count was 9300 with normal differential, hemoglobin normal at 14.1.  Alkaline phosphatase was increased at 474, ALT slightly increased at 60, total bilirubin 0.9, otherwise grossly unremarkable CMP.  Previously labs  on 02/03/2024  showed platelet count of 434,000, white count 7100 with normal differential, hemoglobin 13.8.  CMP was unremarkable at that time with alkaline phosphatase of 138.  Hepatitis C total antibody was negative.  She was referred to us  for further evaluation of thrombocytosis.  On review of previous records, she has had intermittent mild thrombocytosis with platelet count of 463,000 in November 2024 and 492,000 in October 2024. Her white and red blood cell counts have remained normal.  In August 2025, she underwent an appendectomy due to a gangrenous appendix. In September 2025, she had a stent placed in her bile duct due to a stone obstruction, followed by a cholecystectomy. She experienced multiple infections during this period.  She has a history of anemia since childhood, with intermittent episodes. During her three pregnancies, she required iron supplementation in the form of vitamins and folic acid. She is currently on a regular aspirin regimen of 325 mg, which she has been taking for four to five years, initially prescribed for elevated heart rate, although she has no history of heart attacks or clotting issues.  She reports experiencing fatigue and symptoms related to menopause, but denies blood clots, abdominal pain, or history of splenic surgery. She has not had any surgeries on her spleen and denies any pressure symptoms or pain related to an enlarged spleen. She mentions experiencing fatigue and other symptoms related to menopause, but denies significant changes in appetite, stating she can go several days without eating.      MEDICAL HISTORY:  Past Medical History:  Diagnosis Date   Carpal tunnel syndrome    DDD (degenerative disc disease), cervical    DDD (degenerative disc disease), lumbar     SURGICAL HISTORY: Past Surgical History:  Procedure Laterality Date   OOPHORECTOMY     OVARIAN CYST REMOVAL     XI ROBOTIC LAPAROSCOPIC ASSISTED APPENDECTOMY N/A 04/18/2023    Procedure: XI ROBOTIC LAPAROSCOPIC ASSISTED APPENDECTOMY;  Surgeon: Mavis Anes, MD;  Location: [facility];  Service: General;  Laterality: N/A;    SOCIAL HISTORY: She reports that she has been smoking. She has never used smokeless tobacco. She reports that she does not drink alcohol and does not use drugs.  Social History   Socioeconomic History   Marital status: Divorced    Spouse name: Not on file   Number of children: Not on file   Years of education: Not on file   Highest education level: Not on file  Occupational History   Not on file  Tobacco Use   Smoking status: Every Day    Current packs/day: 1.00    Types: Cigarettes   Smokeless tobacco: Never  Substance and Sexual Activity   Alcohol use: No   Drug use: No   Sexual activity: Not on file  Other Topics Concern   Not on file  Social History Narrative   Not on file   Social Drivers of Health   Financial Resource Strain: Medium Risk (03/18/2024)   Received from [facility]   Overall Financial Resource Strain (CARDIA)    How hard is it for you to pay for the very basics like food, housing, medical care, and heating?: Somewhat hard  Food Insecurity: No Food Insecurity (04/21/2024)   Hunger Vital Sign    Worried About Running Out of Food in the Last Year: Never true    Ran Out of Food in the Last Year: Never true  Transportation Needs: No Transportation Needs (04/21/2024)   PRAPARE - Transportation    Lack of  Transportation (Medical): No    Lack of Transportation (Non-Medical): No  Physical Activity: Inactive (03/18/2024)   Received from [facility]   Exercise Vital Sign    On average, how many days per week do you engage in moderate to strenuous exercise (like a brisk walk)?: 0 days    Minutes of Exercise per Session: Not on file  Stress: Patient Declined (03/18/2024)   Received from [facility] - Occupational Stress Questionnaire    Do you feel stress - tense,  restless, nervous, or anxious, or unable to sleep at night because your mind is troubled all the time - these days?: Patient declined  Social Connections: Socially Isolated (03/18/2024)   Received from [facility]   Social Network    How would you rate your social network (family, work, friends)?: Little participation, lonely and socially isolated  Intimate Partner Violence: Not At Risk (03/18/2024)   Received from [facility]   HITS    Over the last 12 months how often did your partner physically hurt you?: Never    Over the last 12 months how often did your partner insult you or talk down to you?: Never    Over the last 12 months how often did your partner threaten you with physical harm?: Never    Over the last 12 months how often did your partner scream or curse at you?: Never    FAMILY HISTORY: No family history on file.  ALLERGIES:  She is allergic to prednisone  and gabapentin (once-daily).  MEDICATIONS:  Current Outpatient Medications  Medication Sig Dispense Refill   albuterol  (PROVENTIL  HFA;VENTOLIN  HFA) 108 (90 Base) MCG/ACT inhaler Inhale 2 puffs into the lungs every 4 (four) hours as needed for wheezing or shortness of breath. 1 Inhaler 0   aspirin EC 81 MG tablet Take 81 mg by mouth daily. Swallow whole.     atorvastatin  (LIPITOR) 40 MG tablet Take 40 mg by mouth at bedtime.     benzonatate  (TESSALON ) 100 MG capsule Take 1 capsule (100 mg total) by mouth 3 (three) times daily as needed for cough. (Patient not taking: Reported on 04/18/2023) 15 capsule 0   fluticasone (FLONASE) 50 MCG/ACT nasal spray Place 1 spray into both nostrils daily as needed for allergies.     ibuprofen  (ADVIL ,MOTRIN ) 600 MG tablet Take 1 tablet (600 mg total) by mouth every 6 (six) hours as needed. 30 tablet 0   loratadine -pseudoephedrine  (CLARITIN -D 12 HOUR) 5-120 MG tablet Take 1 tablet by mouth 2 (two) times daily. (Patient taking differently: Take 1 tablet by mouth daily as needed for allergies.)  20 tablet 0   tiZANidine (ZANAFLEX) 4 MG tablet Take 4 mg by mouth at bedtime as needed and may repeat dose one time if needed for muscle spasms.     No current facility-administered medications for this visit.    REVIEW OF SYSTEMS:    Review of Systems - Oncology  All other pertinent systems were reviewed with the patient and are negative.  PHYSICAL EXAMINATION:   Onc Performance Status - 04/22/24 1046       ECOG Perf Status   ECOG Perf Status Restricted in physically strenuous activity but ambulatory and able to carry out work of a light or sedentary nature, e.g., light house work, office work      KPS SCALE   KPS % SCORE Able to carry on normal activity, minor s/s of disease  Vitals:   04/22/24 1037  BP: 103/73  Pulse: 96  Resp: 17  Temp: 98.2 F (36.8 C)  SpO2: 95%   Filed Weights   04/22/24 1037  Weight: 186 lb 14.4 oz (84.8 kg)    Physical Exam Constitutional:      General: She is not in acute distress.    Appearance: Normal appearance.  HENT:     Head: Normocephalic and atraumatic.  Cardiovascular:     Rate and Rhythm: Normal rate.  Pulmonary:     Effort: Pulmonary effort is normal. No respiratory distress.  Abdominal:     General: There is no distension.  Neurological:     General: No focal deficit present.     Mental Status: She is alert and oriented to person, place, and time.  Psychiatric:        Mood and Affect: Mood normal.        Behavior: Behavior normal.     LABORATORY DATA:   I have reviewed the data as listed  Results for orders placed or performed in visit on 04/22/24  CBC with Differential (Cancer Center Only)  Result Value Ref Range   WBC Count 6.5 4.0 - 10.5 K/uL   RBC 4.89 3.87 - 5.11 MIL/uL   Hemoglobin 12.2 12.0 - 15.0 g/dL   HCT 61.1 63.9 - 53.9 %   MCV 79.3 (L) 80.0 - 100.0 fL   MCH 24.9 (L) 26.0 - 34.0 pg   MCHC 31.4 30.0 - 36.0 g/dL   RDW 82.1 (H) 88.4 - 84.4 %   Platelet Count 418 (H) 150 - 400 K/uL    nRBC 0.0 0.0 - 0.2 %   Neutrophils Relative % 46 %   Neutro Abs 3.0 1.7 - 7.7 K/uL   Lymphocytes Relative 34 %   Lymphs Abs 2.2 0.7 - 4.0 K/uL   Monocytes Relative 11 %   Monocytes Absolute 0.7 0.1 - 1.0 K/uL   Eosinophils Relative 8 %   Eosinophils Absolute 0.5 0.0 - 0.5 K/uL   Basophils Relative 1 %   Basophils Absolute 0.1 0.0 - 0.1 K/uL   Immature Granulocytes 0 %   Abs Immature Granulocytes 0.01 0.00 - 0.07 K/uL  CMP (Cancer Center only)  Result Value Ref Range   Sodium 138 135 - 145 mmol/L   Potassium 4.7 3.5 - 5.1 mmol/L   Chloride 98 98 - 111 mmol/L   CO2 33 (H) 22 - 32 mmol/L   Glucose, Bld 110 (H) 70 - 99 mg/dL   BUN 10 6 - 20 mg/dL   Creatinine 9.13 9.55 - 1.00 mg/dL   Calcium  9.6 8.9 - 10.3 mg/dL   Total Protein 8.2 (H) 6.5 - 8.1 g/dL   Albumin 4.2 3.5 - 5.0 g/dL   AST 24 15 - 41 U/L   ALT 19 0 - 44 U/L   Alkaline Phosphatase 147 (H) 38 - 126 U/L   Total Bilirubin 0.3 0.0 - 1.2 mg/dL   GFR, Estimated >39 >39 mL/min   Anion gap 7 5 - 15  Iron and Iron Binding Capacity ([facility] only)  Result Value Ref Range   Iron 30 28 - 170 ug/dL   TIBC 491 (H) 250 - 450 ug/dL   Saturation Ratios 6 (L) 10.4 - 31.8 %   UIBC 478 (H) 148 - 442 ug/dL  Ferritin  Result Value Ref Range   Ferritin 16 11 - 307 ng/mL  Sedimentation rate  Result Value Ref Range   Sed Rate 40 (H)  0 - 22 mm/hr  C-reactive protein  Result Value Ref Range   CRP 0.8 <1.0 mg/dL  Lactate dehydrogenase  Result Value Ref Range   LDH 217 (H) 98 - 192 U/L     RADIOGRAPHIC STUDIES:  No recent pertinent imaging available to review.   Orders Placed This Encounter  Procedures   CBC with Differential (Cancer Center Only)    Standing Status:   Future    Number of Occurrences:   1    Expiration Date:   04/22/2025   CMP (Cancer Center only)    Standing Status:   Future    Number of Occurrences:   1    Expiration Date:   04/22/2025   Iron and Iron Binding Capacity ([facility] only)    Standing  Status:   Future    Number of Occurrences:   1    Expiration Date:   04/22/2025   Ferritin    Standing Status:   Future    Number of Occurrences:   1    Expiration Date:   04/22/2025   Sedimentation rate    Standing Status:   Future    Number of Occurrences:   1    Expiration Date:   04/22/2025   C-reactive protein    Standing Status:   Future    Number of Occurrences:   1    Expiration Date:   04/22/2025   Lactate dehydrogenase    Standing Status:   Future    Number of Occurrences:   1    Expiration Date:   04/22/2025   JAK2 V617F rfx CALR/MPL/E12-15    Standing Status:   Future    Number of Occurrences:   1    Expiration Date:   04/22/2025    Future Appointments  Date Time Provider Department Center  05/07/2024  3:15 PM Medha Pippen, Chinita, MD [facility] None  07/23/2024  2:45 PM [facility] None  07/23/2024  3:15 PM Lenville Hibberd, Chinita, MD [facility] None     This document was completed utilizing speech recognition software. Grammatical errors, random word insertions, pronoun errors, and incomplete sentences are an occasional consequence of this system due to software limitations, ambient noise, and hardware issues. Any formal questions or concerns about the content, text or information contained within the body of this dictation should be directly addressed to the provider for clarification.

## 2024-04-26 ENCOUNTER — Encounter: Payer: Self-pay | Admitting: Oncology

## 2024-04-26 DIAGNOSIS — D75839 Thrombocytosis, unspecified: Secondary | ICD-10-CM | POA: Insufficient documentation

## 2024-04-26 NOTE — Assessment & Plan Note (Signed)
 Chronic thrombocytosis with fluctuating platelet counts, peaking at 607,000 in September 2025. Normal white and red blood cell counts.   Recent gangrenous appendicitis and gallbladder issues, including a bile duct stone, may have contributed to elevated platelet counts due to stress and inflammation. Differential diagnosis includes reactive thrombocytosis due to low iron levels or inflammation, and potential bone marrow disorder, though less likely given normal white and red blood cell counts.   No symptoms of bone marrow disorder such as fatigue, blood clots, or splenomegaly. Anemia since childhood, with iron supplementation during pregnancies.   Current high aspirin dosage may risk kidney function impairment, heartburn, and ulcers.  Labs today showed improved platelet count of 418,000.  White count 6500 with normal differential.  Hemoglobin normal at 12.2.  Will pursue workup for intraparenchymal cirrhosis including iron studies, ESR, CRP, JAK2 mutation analysis with reflex testing to rule out primary myeloproliferative neoplasm.  - Advise switching from 325 mg aspirin to 81 mg enteric-coated aspirin for clot prevention.  - Advise staying hydrated   - Call her in two weeks to discuss results of current workup.  - Schedule follow-up appointment in three months to repeat labs and assess platelet count.

## 2024-04-30 LAB — JAK2 V617F RFX CALR/MPL/E12-15

## 2024-04-30 LAB — CALR +MPL + E12-E15  (REFLEX)

## 2024-05-07 ENCOUNTER — Inpatient Hospital Stay: Attending: Oncology | Admitting: Oncology

## 2024-05-07 DIAGNOSIS — D75839 Thrombocytosis, unspecified: Secondary | ICD-10-CM | POA: Diagnosis not present

## 2024-05-07 DIAGNOSIS — E611 Iron deficiency: Secondary | ICD-10-CM | POA: Diagnosis not present

## 2024-05-07 MED ORDER — FERROUS SULFATE 325 (65 FE) MG PO TBEC: 325.0000 mg | DELAYED_RELEASE_TABLET | Freq: Every day | ORAL | 3 refills | Status: AC

## 2024-05-07 NOTE — Progress Notes (Signed)
 "  Angel [facility]  HEMATOLOGY-ONCOLOGY ELECTRONIC VISIT PROGRESS NOTE  PATIENT NAME: Angel [facility]ia   MR#: [id number] DOB: 03-19-1966  DATE OF SERVICE: 05/07/2024  Patient Care Team: Reggie Nian, Thora, NP as PCP - General (Nurse Practitioner) Harvey Margo CROME, MD (Inactive) as Consulting Physician (Gastroenterology)  I connected with the patient via telephone conference and verified that I am speaking with the correct person using two identifiers. The patient's location is at home and I am providing care from the [facility].  I discussed the limitations, risks, security and privacy concerns of performing an evaluation and management service by e-visits and the availability of in person appointments. I also discussed with the patient that there may be a patient responsible charge related to this service. The patient expressed understanding and agreed to proceed.   ASSESSMENT & PLAN:   Angel Tapia is a 58 y.o. 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  Likely reactionary from cholecystitis around the time of thrombocytosis.  Also contributed by iron deficiency state.  Workup negative for MPN.  Thrombocytosis Chronic thrombocytosis with fluctuating platelet counts, peaking at 607,000 in September 2025. Normal white and red blood cell counts.   Recent gangrenous appendicitis and gallbladder issues, including a bile duct stone, may have contributed to elevated platelet counts due to stress and inflammation.  Differential diagnoses considered include  reactive thrombocytosis due to low iron levels or inflammation, and potential bone marrow disorder, though less likely given normal white and red  blood cell counts.   No symptoms of bone marrow disorder such as fatigue, blood clots, or splenomegaly. Anemia since childhood, with iron supplementation during pregnancies.   Previous high aspirin dosage may risk kidney function impairment, heartburn, and ulcers.  On her consultation with us  on 04/22/2024, labs showed improved platelet count of 418,000.  White count 6500 with normal differential.  Hemoglobin normal at 12.2.  Iron studies indicated iron deficiency with iron saturation of 6%, increased iron binding capacity.  Ferritin was borderline low at 16.  Sed rate was increased at 40 mm/h, CRP normal at 0.8.   JAK2 V617F mutation analysis, followed by reflex testing to include CALR, MPL, exon 12-15 mutations were all negative.  No evidence of primary myeloproliferative neoplasm.    - Advise switching from 325 mg aspirin to 81 mg enteric-coated aspirin for clot prevention.   - Advise staying hydrated    - Schedule follow-up appointment in three months to repeat labs and assess platelet count.  Iron deficiency without anemia Iron deficiency confirmed by low iron stores and other parameters, though hemoglobin is normal, indicating no anemia. Iron deficiency may contribute to thrombocytosis. - Prescribed iron supplement once daily. - Advised taking iron with vitamin C to enhance absorption.   I discussed the assessment and treatment plan with the patient. The patient was provided an opportunity to ask questions and all were answered. The patient agreed with the plan and demonstrated an understanding of the instructions. The patient was advised to call back or seek an in-person evaluation if the symptoms worsen or if the condition fails to improve as anticipated.    I spent 15 minutes over the phone with the patient reviewing test results, discuss management and coordination/planning of care.  Chinita Patten, MD 05/07/2024 3:19 PM [facility]  [facility] - A DEPT  OF [facility] [address] [place] [address] Dept: [phone] Dept Fax: [phone]   INTERVAL HISTORY:  Please see above for problem oriented charting.  The purpose of today's discussion is to explain recent lab results and to formulate plan of care.  Discussed the use of AI scribe software for clinical note transcription with the patient, who gave verbal consent to proceed.  History of Present Illness Angel Tapia is a 58 year old female who presents for follow-up of her hematological workup.  Her platelet count is 418,000, which is slightly elevated compared to the normal range of 400,000. This represents an improvement from previous levels.  Her iron levels are low, with borderline iron stores. She recalls being on iron supplements during pregnancy and responded well to them.  She reports that she has recently reduced her aspirin dosage to 81 mg.  She has a history of recent appendicitis and gallbladder issues.   SUMMARY OF HEMATOLOGY HISTORY:  She was referred by her primary care physician, Gordy Sparks, for evaluation of elevated platelet counts.   On 03/06/2024, labs at her PCPs office showed platelet count of 607,000.  White count was 9300 with normal differential, hemoglobin normal at 14.1.  Alkaline phosphatase was increased at 474, ALT slightly increased at 60, total bilirubin 0.9, otherwise grossly unremarkable CMP.   Previously labs on 02/03/2024 showed platelet count of 434,000, white count 7100 with normal differential, hemoglobin 13.8.  CMP was unremarkable at that time with alkaline phosphatase of 138.  Hepatitis C total antibody was negative.  She was referred to us  for further evaluation of thrombocytosis.   On review of previous records, she has had intermittent mild thrombocytosis with platelet count of 463,000 in November 2024 and 492,000 in October 2024. Her white and red blood cell counts have remained normal.   In  August 2025, she underwent an appendectomy due to a gangrenous appendix. In September 2025, she had a stent placed in her bile duct due to a stone obstruction, followed by a cholecystectomy. She experienced multiple infections during this period.   She has a history of anemia since childhood, with intermittent episodes. During her three pregnancies, she required iron supplementation in the form of vitamins and folic acid. She is currently on a regular aspirin regimen of 325 mg, which she has been taking for four to five years, initially prescribed for elevated heart rate, although she has no history of heart attacks or clotting issues.   She reports experiencing fatigue and symptoms related to menopause, but denies blood clots, abdominal pain, or history of splenic surgery. She has not had any surgeries on her spleen and denies any pressure symptoms or pain related to an enlarged spleen. She mentions experiencing fatigue and other symptoms related to menopause, but denies significant changes in appetite, stating she can go several days without eating.  Chronic thrombocytosis with fluctuating platelet counts, peaking at 607,000 in September 2025. Normal white and red blood cell counts.    Recent gangrenous appendicitis and gallbladder issues, including a bile duct stone, may have contributed to elevated platelet counts due to stress and inflammation. Differential diagnosis includes reactive thrombocytosis due to low iron levels or inflammation, and potential bone marrow disorder, though less likely given normal white and red blood cell counts.    No symptoms of bone marrow disorder such as fatigue, blood clots, or splenomegaly. Anemia since childhood, with iron supplementation during  pregnancies.    Current high aspirin dosage may risk kidney function impairment, heartburn, and ulcers.   On her consultation with us  on 04/22/2024, labs showed improved platelet count of 418,000.  White count 6500 with  normal differential.  Hemoglobin normal at 12.2.  Iron studies indicated iron deficiency with iron saturation of 6%, increased iron binding capacity.  Ferritin was borderline low at 16.  Sed rate was increased at 40 mm/h, CRP normal at 0.8.   JAK2 V617F mutation analysis, followed by reflex testing to include CALR, MPL, exon 12-15 mutations were all negative.  No evidence of primary myeloproliferative neoplasm.    - Advise switching from 325 mg aspirin to 81 mg enteric-coated aspirin for clot prevention.   - Advise staying hydrated    - Schedule follow-up appointment in three months to repeat labs and assess platelet count.   REVIEW OF SYSTEMS:    Review of Systems - Oncology  All other pertinent systems were reviewed with the patient and are negative.  I have reviewed the past medical history, past surgical history, social history and family history with the patient and they are unchanged from previous note.  ALLERGIES:  She is allergic to prednisone  and gabapentin (once-daily).  MEDICATIONS:  Current Outpatient Medications  Medication Sig Dispense Refill   ferrous sulfate  325 (65 FE) MG EC tablet Take 1 tablet (325 mg total) by mouth daily with breakfast. 30 tablet 3   albuterol  (PROVENTIL  HFA;VENTOLIN  HFA) 108 (90 Base) MCG/ACT inhaler Inhale 2 puffs into the lungs every 4 (four) hours as needed for wheezing or shortness of breath. 1 Inhaler 0   amoxicillin (AMOXIL) 500 MG capsule Take 500 mg by mouth every 8 (eight) hours.     amoxicillin (AMOXIL) 500 MG tablet Take 500 mg by mouth every 8 (eight) hours.     aspirin EC 81 MG tablet Take 81 mg by mouth daily. Swallow whole.     atorvastatin  (LIPITOR) 40 MG tablet Take 40 mg by mouth at bedtime.     benzonatate  (TESSALON ) 100 MG capsule Take 1 capsule (100 mg total) by mouth 3 (three) times daily as needed for cough. 15 capsule 0   busPIRone (BUSPAR) 10 MG tablet Take 10 mg by mouth 2 (two) times daily.     celecoxib (CELEBREX) 100  MG capsule Take 100 mg by mouth 2 (two) times daily.     chlorhexidine  (PERIDEX ) 0.12 % solution SWISH 1 CAPFUL FOR 1 MINUTE AND EXPECTORATE TWICE A DAY FOR 10 DAYS     fluticasone (FLONASE) 50 MCG/ACT nasal spray Place 1 spray into both nostrils daily as needed for allergies.     ibuprofen  (ADVIL ,MOTRIN ) 600 MG tablet Take 1 tablet (600 mg total) by mouth every 6 (six) hours as needed. 30 tablet 0   metoprolol succinate (TOPROL-XL) 25 MG 24 hr tablet Take 25 mg by mouth daily.     naloxone (NARCAN) nasal spray 4 mg/0.1 mL SMARTSIG:Both Nares     oxyCODONE -acetaminophen  (PERCOCET) 10-325 MG tablet Take 1 tablet by mouth 5 (five) times daily as needed.     pantoprazole (PROTONIX) 40 MG tablet Take 40 mg by mouth 2 (two) times daily.     tiZANidine (ZANAFLEX) 4 MG tablet Take 4 mg by mouth at bedtime as needed and may repeat dose one time if needed for muscle spasms.     venlafaxine (EFFEXOR) 25 MG tablet Take 25 mg by mouth 2 (two) times daily.     Vitamin D, Ergocalciferol, (DRISDOL) 1.25  MG (50000 UNIT) CAPS capsule Take 50,000 Units by mouth once a week.     No current facility-administered medications for this visit.    PHYSICAL EXAMINATION:  Not performed today as it was a phone only visit  LABORATORY DATA:   I have reviewed the data as listed.  Recent Results (from the past 2160 hours)  CBC with Differential ([facility] Only)     Status: Abnormal   Collection Time: 04/22/24 11:31 AM  Result Value Ref Range   WBC Count 6.5 4.0 - 10.5 K/uL   RBC 4.89 3.87 - 5.11 MIL/uL   Hemoglobin 12.2 12.0 - 15.0 g/dL   HCT 61.1 63.9 - 53.9 %   MCV 79.3 (L) 80.0 - 100.0 fL   MCH 24.9 (L) 26.0 - 34.0 pg   MCHC 31.4 30.0 - 36.0 g/dL   RDW 82.1 (H) 88.4 - 84.4 %   Platelet Count 418 (H) 150 - 400 K/uL   nRBC 0.0 0.0 - 0.2 %   Neutrophils Relative % 46 %   Neutro Abs 3.0 1.7 - 7.7 K/uL   Lymphocytes Relative 34 %   Lymphs Abs 2.2 0.7 - 4.0 K/uL   Monocytes Relative 11 %   Monocytes  Absolute 0.7 0.1 - 1.0 K/uL   Eosinophils Relative 8 %   Eosinophils Absolute 0.5 0.0 - 0.5 K/uL   Basophils Relative 1 %   Basophils Absolute 0.1 0.0 - 0.1 K/uL   Immature Granulocytes 0 %   Abs Immature Granulocytes 0.01 0.00 - 0.07 K/uL    Comment: Performed at [facility], [address] [place], [place] [address]  CMP ([facility] only)     Status: Abnormal   Collection Time: 04/22/24 11:31 AM  Result Value Ref Range   Sodium 138 135 - 145 mmol/L   Potassium 4.7 3.5 - 5.1 mmol/L   Chloride 98 98 - 111 mmol/L   CO2 33 (H) 22 - 32 mmol/L    Comment: (NOTE) Elevated LDH levels may cause falsely increased CO2 results. If LDH is >2000 U/L, a positive bias of 12% is possible.     Glucose, Bld 110 (H) 70 - 99 mg/dL    Comment: Glucose reference range applies only to samples taken after fasting for at least 8 hours.   BUN 10 6 - 20 mg/dL   Creatinine 9.13 9.55 - 1.00 mg/dL   Calcium  9.6 8.9 - 10.3 mg/dL   Total Protein 8.2 (H) 6.5 - 8.1 g/dL   Albumin 4.2 3.5 - 5.0 g/dL   AST 24 15 - 41 U/L   ALT 19 0 - 44 U/L   Alkaline Phosphatase 147 (H) 38 - 126 U/L   Total Bilirubin 0.3 0.0 - 1.2 mg/dL   GFR, Estimated >39 >39 mL/min    Comment: (NOTE) Calculated using the CKD-EPI Creatinine Equation (2021)    Anion gap 7 5 - 15    Comment: Performed at [facility], [address], [place], [place] [address]  Iron and Iron Binding Capacity ([facility] only)     Status: Abnormal   Collection Time: 04/22/24 11:31 AM  Result Value Ref Range   Iron 30 28 - 170 ug/dL   TIBC 491 (H) 749 - 549 ug/dL   Saturation Ratios 6 (L) 10.4 - 31.8 %   UIBC 478 (H) 148 - 442 ug/dL    Comment: Performed at [facility], [address], [place], [place] [address]  Sedimentation  rate     Status: Abnormal   Collection Time: 04/22/24 11:31 AM  Result Value Ref Range   Sed Rate 40 (H) 0 - 22 mm/hr    Comment: Performed at [facility], [address] [place], [place] [address]  JAK2 V617F rfx CALR/MPL/E12-15     Status: None   Collection Time: 04/22/24 11:31 AM  Result Value Ref Range   Specimen Type Comment:     Comment: NOT PROVIDED CORRECTED ON 11/07 AT 1436: PREVIOUSLY REPORTED AS JAK2 V617F     JAK2 V617F Result Comment     Comment: (NOTE) NEGATIVE The JAK2 V617F mutation is not detected in the provided specimen of this individual. Results should be interpreted in conjunction with clinical and other laboratory findings for the most accurate interpretation. This test was developed and its performance characteristics determined by [facility]. It has not been cleared or approved by the Food and Drug Administration.    Reflex Comment     Comment: (NOTE) Reflex to CALR Mutation Analysis, JAK2 Exon 12-15 Mutation Analysis, and MPL Mutation Analysis is indicated.    V617F Rfx CALR/MPL/E12-15 Bkgd Comment     Comment: (NOTE)    Molecular testing of blood or bone marrow is useful in the evaluation of suspected myeloproliferative neoplasms (MPN). Mutations in the JAK2, MPL, and CALR genes are present in virtually all MPNs and their presence help distinguish benign reactive processes from clonal neoplasms. These mutations are generally considered mutually exclusive, although concurrent clones have been reported in rare patients. This test will assess for the JAK2V617F (exon 14) mutation first and will reflex to CALR mutation analysis, MPL mutation analysis, and JAK2 exon 12 to 15 mutation analysis if the JAK2V617F mutation is negative.    The JAK2 (Janus kinase 2) gene encodes for a non-receptor protein tyrosine kinase that activates cytokine and growth factor signaling. The V617F (c.1849 G>T) mutation results in constitutive activation of JAK2 and downstream STAT5 and ERK signaling. The V617F mutation is observed in approximately 95% of polycythemia vera (PV), 60% of essential thromboc  ythemia (ET) and primary myelofibrosis (PMF). It is also infrequently present (3-5%) in myelodysplastic syndrome, chronic myelomonocytic leukemia, and other atypical chronic myeloid disorders. A small percentage of JAK2 mutation positive patients (3.3%) contain other non-V617F mutations within exons 12 to 15. In particular, mutations in exon 12 of JAK2 have been described in approximately 3% of patients with PV. JAK2 allele burden correlates with clinical phenotype, with low levels of mutant allele characterized by thrombocytosis, intermediate levels with erythrocytosis, and high mutant allele burden correlating with enhanced myelopoiesis of the BM, leukocytosis, increasing spleen size, and circulating CD34-positive cells.    The CALR (Calreticulin) gene encodes for a multifunctional calcium -binding protein involved in many cellular activities such as growth, proliferation, adhesion, and programmed cell death. Among patients with JAK2 negative MPNs, CALR are found in a pproximately 70% of patients with JAK2-negative essential thrombocythemia (ET) and 60-88% of patients with JAK2-negative primary myelofibrosis(PMF). Only a minority of patients (approximately 8%) with myelodysplasia have mutations in the CALR gene. CALR mutations are rarely detected in patients with de novo acute myeloid leukemia, chronic myelogenous leukemia, lymphoid leukemia, or solid tumors. CALR mutations are not detected in polycythemia and generally appear to be mutually exclusive with JAK2 mutations and MPL mutations. The majority of mutational changes involve a variety of insertion deletion mutations in exon 9 of the calreticulin gene: approximately 53% of all CALR mutations are a 52 bp deletion (type-1) while the  second most prevalent mutation (approximately 32%) contains a 5 bp insertion (type-2). Other mutations (non-type 1 or type 2) are seen in a small minority of cases. CALR mutations in PMF tend to  be with a favorable prognosis compared to JAK2 V617F trw automotive, whereas primary myelofibrosis negative for CALR, JAK2 V617F and MPL mutations (so-called triple negative) is associated with a poor prognosis and shorter survival.    The MPL (myeloproliferative leukemia virus oncogene) gene encodes the thrombopoietin receptor which regulates hematopoiesis and megakaryopoiesis. Activating MPL mutations are associated with a subset of myeloproliferative neoplasms and acute megakaryoblastic leukemia. MPL W515 mutations are present in approximately 5-8% of patients with primary myelofibrosis (PMF) and 1-4% of patients with essential thrombocythemia (ET). The S505 mutation is detected in patients with hereditary thrombocythemia.    Limitations    This assay has a sensitivity of approximately 1% VAF for JAK2 V617F, 2.5% VAF for other mutations in JAK2 exons 12 to 15, CALR mutations, and MPL mutations. Deletions in JAK2 up to 6 bp and insertions up to 34 bp have been detected in validation studies. Deletions in CALR up to 70 bp and i nsertions up to 12 bp have been detected in validation studies.    Method based next generation sequencing.     Comment: Comment [employer]    References Comment     Comment: (NOTE) Alghasham N, Alnouri Y, Abalkhail H, Collier RAMAN. Detection of mutations in JAK2 exons 12-15 by [employer] sequencing. Int J Lab Hematol. 2016 Feb;38(1):34-41. doi: 10.1111/ijlh.87574. Epub 2015 Sep 11. PMID: 73638915. Glorine ISLE, Cheryll LABOR, Hasserjian R, Omega PARAS, Borowitz MJ, Ladora Campanile MM, [place] CD, Cazzola M, Vardiman JW. The 2016 revision to the World Health Organization classification of myeloid neoplasms and acute leukemia. Blood. 2016 May 19;127(20):2391-405. doi: 10.1182/blood-2016-03-643544. Epub 2016 Apr 11. PMID: 72930745. Honor LELON Glennie VEAR Laurita OLEGARIO Halford [facility], Zhang ZJ, Erwin S, Albitar M. Mutation profile of JAK2 transcripts in patients with chronic myeloproliferative  neoplasias. J Mol Diagn. 2009 Jan;11(1):49-53.doi: 10.2353/jmoldx.2009.080114. Epub 2008 Dec 12. PMID: 80925404; PMCID: EFR7392434. NCCN Clinical Practice Guidelines in Oncology (NCCN Guidelines) Myeloproliferative Neoplasms Version 3.2022 - February 01, 2021. Swerdlow SH, programmer, multimedia. WHO classif ication of Tumours of Haematopoietic and Lymphoid Tissues. 4th edn. Ep[employer]an[employer], [place]: Geologist, Engineering for [employer] on [employer]; 2017. Tefferi A. Primary myelofibrosis: 2021 update on diagnosis, risk-stratification and management. Am J Hematol. 2021 Jan;96(1):145-162. doi: 10.1002/ajh.26050. Epub 2020 Dec 2. PMID: 66802950. Vainchenker W, Kralovics R. Genetic basis and molecular pathophysiology of classical myeloproliferative neoplasms. Blood. 2017 Feb 9;129(6):667-679. doi: 10.1182/blood-2016-10-695940. Epub 2016 Dec 27. PMID: 71971970.    Director Review Comment     Comment: (NOTE) Technical Component performed at [facility] Professional Component performed by: Rolan Flatten, PhD, [facility] Director, Molecular Oncology [facility] [phone], [address] [place] [address] [phone] Performed At: [facility] [address] [place], [place] [id number] Loran Gales MDPhD Ey:[phone] Performed At: [facility] [address] [place], [place] [id number] Loran Gales MDPhD Ey:[id number]   CALR +MPL + E12-E15 (reflexed)     Status: None   Collection Time: 04/22/24 11:31 AM  Result Value Ref Range   CALR Result Comment     Comment: (NOTE) NEGATIVE No insertions or deletions were detected within the analyzed region of the calreticulin (CALR) gene. A negative result does not entirely exclude the possibility of a clonal population carrying CALR gene mutations that are not covered by this assay. Results should be interpreted in conjunction with  clinical and laboratory findings for the most accurate interpretation.    MPL Result Comment     Comment:  (NOTE) NEGATIVE No MPL mutation was identified in the provided specimen of this individual. Results should be interpreted in conjunction with clinical and other laboratory findings for the most accurate interpretation.    E12-15 Result Comment     Comment: (NOTE) NEGATIVE    JAK2 mutations were not detected in exons 12, 13, 14 and 15. The G to T nucleotide change encoding the V617F mutation was not detected. This result does not rule out the presence of JAK2 mutation at a level below the detection sensitivity of this assay, the presence of other mutations outside the analyzed region of the JAK2 gene, or the presence of a myeloproliferative or other neoplasm. Result must be correlated with other clinical data for the most accurate diagnosis. Performed At: [facility] [address] [place], [place] [id number] Loran Gales MDPhD Ey:[id number]   Ferritin     Status: None   Collection Time: 04/22/24 11:32 AM  Result Value Ref Range   Ferritin 16 11 - 307 ng/mL    Comment: Performed at [facility], [address], [place], [place] [address]  C-reactive protein     Status: None   Collection Time: 04/22/24 11:32 AM  Result Value Ref Range   CRP 0.8 <1.0 mg/dL    Comment: Performed at [facility], [address], [place], [place] [address]  Lactate dehydrogenase     Status: Abnormal   Collection Time: 04/22/24 11:32 AM  Result Value Ref Range   LDH 217 (H) 98 - 192 U/L    Comment: Performed at [facility], [address] [place], [place] [address]     RADIOGRAPHIC STUDIES:  No recent pertinent imaging studies available to review.  Orders Placed This Encounter  Procedures   CBC with Differential ([facility] Only)    Standing Status:   Future    Expiration Date:   05/07/2025   CMP ([facility] only)    Standing Status:   Future    Expiration Date:   05/07/2025   Iron and Iron Binding Capacity ([facility] only)     Standing Status:   Future    Expiration Date:   05/07/2025   Ferritin    Standing Status:   Future    Expiration Date:   05/07/2025     Future Appointments  Date Time Provider Department [facility]  07/23/2024  2:45 PM [facility] None  07/23/2024  3:15 PM Narcissa Melder, Chinita, MD [facility] None    This document was completed utilizing speech recognition software. Grammatical errors, random word insertions, pronoun errors, and incomplete sentences are an occasional consequence of this system due to software limitations, ambient noise, and hardware issues. Any formal questions or concerns about the content, text or information contained within the body of this dictation should be directly addressed to the provider for clarification.  "

## 2024-06-14 ENCOUNTER — Encounter: Payer: Self-pay | Admitting: Oncology

## 2024-06-14 NOTE — Assessment & Plan Note (Signed)
 Chronic thrombocytosis with fluctuating platelet counts, peaking at 607,000 in September 2025. Normal white and red blood cell counts.   Recent gangrenous appendicitis and gallbladder issues, including a bile duct stone, may have contributed to elevated platelet counts due to stress and inflammation.  Differential diagnoses considered include  reactive thrombocytosis due to low iron levels or inflammation, and potential bone marrow disorder, though less likely given normal white and red blood cell counts.   No symptoms of bone marrow disorder such as fatigue, blood clots, or splenomegaly. Anemia since childhood, with iron supplementation during pregnancies.   Previous high aspirin dosage may risk kidney function impairment, heartburn, and ulcers.  On her consultation with us  on 04/22/2024, labs showed improved platelet count of 418,000.  White count 6500 with normal differential.  Hemoglobin normal at 12.2.  Iron studies indicated iron deficiency with iron saturation of 6%, increased iron binding capacity.  Ferritin was borderline low at 16.  Sed rate was increased at 40 mm/h, CRP normal at 0.8.   JAK2 V617F mutation analysis, followed by reflex testing to include CALR, MPL, exon 12-15 mutations were all negative.  No evidence of primary myeloproliferative neoplasm.    - Advise switching from 325 mg aspirin to 81 mg enteric-coated aspirin for clot prevention.   - Advise staying hydrated    - Schedule follow-up appointment in three months to repeat labs and assess platelet count.

## 2024-07-22 ENCOUNTER — Telehealth: Payer: Self-pay | Admitting: Oncology

## 2024-07-22 NOTE — Telephone Encounter (Signed)
 I spoke with patient as she called in to reschedule lab and MD appointments from 07/23/2024 to 08/06/2024.

## 2024-07-23 ENCOUNTER — Inpatient Hospital Stay: Admitting: Oncology

## 2024-07-23 ENCOUNTER — Inpatient Hospital Stay: Attending: Oncology

## 2024-08-06 ENCOUNTER — Inpatient Hospital Stay: Admitting: Oncology

## 2024-08-06 ENCOUNTER — Inpatient Hospital Stay: Attending: Oncology
